# Patient Record
Sex: Female | Born: 2000 | Hispanic: Yes | Marital: Single | Smoking: Never smoker
Health system: Southern US, Community
[De-identification: ages and names within clinical notes are randomized; demographics above are authoritative.]

## PROBLEM LIST (undated history)

## (undated) DIAGNOSIS — R519 Headache, unspecified: Secondary | ICD-10-CM

## (undated) DIAGNOSIS — Z789 Other specified health status: Secondary | ICD-10-CM

## (undated) DIAGNOSIS — R42 Dizziness and giddiness: Secondary | ICD-10-CM

## (undated) HISTORY — PX: NO PAST SURGERIES: SHX2092

## (undated) HISTORY — DX: Other specified health status: Z78.9

## (undated) HISTORY — DX: Headache, unspecified: R51.9

## (undated) HISTORY — PX: OTHER SURGICAL HISTORY: SHX169

## (undated) HISTORY — DX: Dizziness and giddiness: R42

---
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
## 2020-06-15 ENCOUNTER — Ambulatory Visit (LOCAL_COMMUNITY_HEALTH_CENTER): Payer: Self-pay

## 2020-06-15 ENCOUNTER — Other Ambulatory Visit: Payer: Self-pay

## 2020-06-15 VITALS — BP 92/56 | Ht 59.0 in | Wt 103.0 lb

## 2020-06-15 DIAGNOSIS — Z3201 Encounter for pregnancy test, result positive: Secondary | ICD-10-CM

## 2020-06-15 LAB — PREGNANCY, URINE: Preg Test, Ur: POSITIVE — AB

## 2020-06-15 MED ORDER — PRENATAL VITAMIN 27-0.8 MG PO TABS: 1.0000 | ORAL_TABLET | Freq: Every day | ORAL | 0 refills | Status: AC

## 2020-06-15 NOTE — Progress Notes (Signed)
Pt desires care at [facility]; sent to preadmit to inquire about Medicaid and [facility] services.

## 2020-07-14 ENCOUNTER — Encounter: Payer: Self-pay | Admitting: Advanced Practice Midwife

## 2020-07-14 ENCOUNTER — Other Ambulatory Visit: Payer: Self-pay | Admitting: Advanced Practice Midwife

## 2020-07-14 ENCOUNTER — Other Ambulatory Visit: Payer: Self-pay

## 2020-07-14 ENCOUNTER — Ambulatory Visit: Payer: Medicaid Other | Admitting: Advanced Practice Midwife

## 2020-07-14 DIAGNOSIS — Z3401 Encounter for supervision of normal first pregnancy, first trimester: Secondary | ICD-10-CM | POA: Diagnosis not present

## 2020-07-14 DIAGNOSIS — O0930 Supervision of pregnancy with insufficient antenatal care, unspecified trimester: Secondary | ICD-10-CM | POA: Insufficient documentation

## 2020-07-14 DIAGNOSIS — Z3400 Encounter for supervision of normal first pregnancy, unspecified trimester: Secondary | ICD-10-CM | POA: Insufficient documentation

## 2020-07-14 DIAGNOSIS — O99019 Anemia complicating pregnancy, unspecified trimester: Secondary | ICD-10-CM

## 2020-07-14 DIAGNOSIS — Z3402 Encounter for supervision of normal first pregnancy, second trimester: Secondary | ICD-10-CM

## 2020-07-14 DIAGNOSIS — Z3492 Encounter for supervision of normal pregnancy, unspecified, second trimester: Secondary | ICD-10-CM

## 2020-07-14 HISTORY — DX: Anemia complicating pregnancy, unspecified trimester: O99.019

## 2020-07-14 LAB — URINALYSIS
Bilirubin, UA: NEGATIVE
Glucose, UA: NEGATIVE
Ketones, UA: NEGATIVE
Nitrite, UA: NEGATIVE
Protein,UA: NEGATIVE
RBC, UA: NEGATIVE
Specific Gravity, UA: 1.025 (ref 1.005–1.030)
Urobilinogen, Ur: 0.2 mg/dL (ref 0.2–1.0)
pH, UA: 6 (ref 5.0–7.5)

## 2020-07-14 LAB — WET PREP FOR TRICH, YEAST, CLUE
Trichomonas Exam: NEGATIVE
Yeast Exam: NEGATIVE

## 2020-07-14 LAB — HEMOGLOBIN, FINGERSTICK: Hemoglobin: 10.7 g/dL — ABNORMAL LOW (ref 11.1–15.9)

## 2020-07-14 MED ORDER — FERROUS SULFATE 324 (65 FE) MG PO TBEC: 1.0000 | DELAYED_RELEASE_TABLET | Freq: Every morning | ORAL | 0 refills | Status: DC

## 2020-07-14 NOTE — Progress Notes (Signed)
[facility] HEALTH DEPT [facility] [address] [place] [address] [phone]  INITIAL PRENATAL VISIT NOTE  Subjective:  Michele Love is a 19 y.o.SHF G1P0000 at [redacted]w[redacted]d being seen today to start prenatal care at the [facility]. She feels "good" about surprise pregnancy with no birth control.  20 yo unemployed FOB feels "happy" about pregnancy and is not in school; in 1 year supportive relationship.  They are both living with FOB's mom and his 2 siblings 27 and 40 yo.  She is not in school and not working.  LMP 01/22/20.  She went to [place] for 2 wks in 01/2020.  Her mom lives in [place] and her Dad lives in [place]. She was born in [place] but moved here when she was 65 mo old.  Denies cigs, vaping, cigars, ETOH.  Denies any u/s or ER visits this pregnancy.  Transportation issues and she didn't know she was pregnant is reason for late entry to care.   She is currently monitored for the following issues for this low-risk pregnancy and has Encounter for supervision of normal first pregnancy in first trimester and Late prenatal care 24 wks on their problem list.  Patient reports no complaints.  Contractions: Not present. Vag. Bleeding: None.  Movement: Present. Denies leaking of fluid.   Indications for ASA therapy (per uptodate) One of the following: Previous pregnancy with preeclampsia, especially early onset and with an adverse outcome No Multifetal gestation No Chronic hypertension No Type 1 or 2 diabetes mellitus No Chronic kidney disease No Autoimmune disease (antiphospholipid syndrome, systemic lupus erythematosus) No  Two or more of the following: Nulliparity Yes Obesity (body mass index >30 kg/m2) No Family history of preeclampsia in mother or sister No Age ?35 years No Sociodemographic characteristics (African American race, low socioeconomic level) Yes Personal risk factors (eg, previous pregnancy with low  birth weight or small for gestational age infant, previous adverse pregnancy outcome [eg, stillbirth], interval >10 years between pregnancies) No    The following portions of the patient's history were reviewed and updated as appropriate: allergies, current medications, past family history, past medical history, past social history, past surgical history and problem list. Problem list updated.  Objective:   Vitals:   07/14/20 0847  BP: 95/62  Pulse: 84  Temp: 97.7 F (36.5 C)  Weight: 109 lb 6.4 oz (49.6 kg)    Fetal Status:     Movement: Present     Physical Exam Vitals and nursing note reviewed.  Constitutional:      General: She is not in acute distress.    Appearance: Normal appearance. She is well-developed and normal weight.  HENT:     Head: Normocephalic and atraumatic.     Right Ear: External ear normal.     Left Ear: External ear normal.     Nose: Nose normal. No congestion or rhinorrhea.     Mouth/Throat:     Lips: Pink.     Mouth: Mucous membranes are moist.     Dentition: Normal dentition. No dental caries.     Pharynx: Oropharynx is clear. Uvula midline.  Eyes:     General: No scleral icterus.    Conjunctiva/sclera: Conjunctivae normal.  Neck:     Thyroid: No thyroid mass or thyromegaly.  Cardiovascular:     Rate and Rhythm: Normal rate.     Pulses: Normal pulses.     Comments: Extremities are warm and well perfused Pulmonary:  Effort: Pulmonary effort is normal.     Breath sounds: Normal breath sounds.  Chest:     Breasts: Breasts are symmetrical.        Right: Normal. No mass, nipple discharge or skin change.        Left: Normal. No mass, nipple discharge or skin change.  Abdominal:     Palpations: Abdomen is soft.     Tenderness: There is no abdominal tenderness.     Comments: Gravid, soft without tenderness, fundal height 24 cm, FHR=150   Genitourinary:    General: Normal vulva.     Exam position: Lithotomy position.     Pubic Area: No rash.       Labia:        Right: No rash.        Left: No rash.      Vagina: Vaginal discharge (thick white creamy leukorrhea, ph<4.5) present.     Cervix: Normal.     Uterus: Enlarged (Gravid 24 wk size). Not tender.      Adnexa: Right adnexa normal and left adnexa normal.     Rectum: Normal. No external hemorrhoid.  Musculoskeletal:     Right lower leg: No edema.     Left lower leg: No edema.  Lymphadenopathy:     Upper Body:     Right upper body: No axillary adenopathy.     Left upper body: No axillary adenopathy.  Skin:    General: Skin is warm.     Capillary Refill: Capillary refill takes less than 2 seconds.  Neurological:     Mental Status: She is alert.     Assessment and Plan:  Pregnancy: G1P0000 at [redacted]w[redacted]d  1. Encounter for supervision of normal first pregnancy in first trimester Late entry to care Anatomy u/s now please - HIV Antibody (routine testing w rflx) - Prenatal Profile I - HCV Ab w Reflex to Quant PCR - Urine Culture - Chlamydia/GC NAA, Confirmation - Lead, blood (adult age 38 yrs or greater) - Hemoglobinopathy evaluation -[id number] - QuantiFERON-TB Gold Plus - [id number] Drug Screen - WET PREP FOR TRICH, YEAST, CLUE - Urinalysis (Urine Dip) - Hemoglobin, venipuncture  2. Late prenatal care 24 wks Too late for Quad screen    Discussed overview of care and coordination with inpatient delivery practices including [facility], [facility], [facility] and [facility].   Reviewed Centering pregnancy as standard of care at [facility], oriented to room and showed video. Based on EDD, plan for Cycle .    Preterm labor symptoms and general obstetric precautions including but not limited to vaginal bleeding, contractions, leaking of fluid and fetal movement were reviewed in detail with the patient.  Please refer to After Visit Summary for other counseling recommendations.   Return in about 4 weeks (around 08/11/2020) for routine PNC, 28 week labs.  No future  appointments.  Alberteen Spindle, CNM

## 2020-07-14 NOTE — Progress Notes (Signed)
Presents for MH IP. Takes PNV daily. Denies ED/Hospital visit since positive PT. Hgb 10.7, iron given, counseled to take with OJ daily. New OB packet given. Flu shot declined. Sharlyne Pacas, RN

## 2020-07-15 LAB — LEAD, BLOOD (ADULT >= 16 YRS): Lead-Whole Blood: <1 ug/dL (ref 0–4)

## 2020-07-16 ENCOUNTER — Telehealth: Payer: Self-pay

## 2020-07-16 LAB — URINE CULTURE

## 2020-07-16 LAB — HGB FRACTIONATION CASCADE
Hgb A2: 2.8 % (ref 1.8–3.2)
Hgb A: 97.2 % (ref 96.4–98.8)
Hgb F: 0 % (ref 0.0–2.0)
Hgb S: 0 %

## 2020-07-16 NOTE — Telephone Encounter (Signed)
Per Epic, anatomy US scheduled for 07/20/2020 at 11 am with arrival time of 10:45 am. Call to client to verify aware of appt. Client aware of appt and facility location. Jossie Ng, RN

## 2020-07-17 LAB — FE+CBC/D/PLT+TIBC+FER+RETIC
Basophils Absolute: 0 10*3/uL (ref 0.0–0.2)
Basos: 1 %
EOS (ABSOLUTE): 0.1 10*3/uL (ref 0.0–0.4)
Eos: 2 %
Ferritin: 21 ng/mL (ref 15–150)
Hematocrit: 32.1 % — ABNORMAL LOW (ref 34.0–46.6)
Hemoglobin: 10.7 g/dL — ABNORMAL LOW (ref 11.1–15.9)
Immature Grans (Abs): 0 10*3/uL (ref 0.0–0.1)
Immature Granulocytes: 0 %
Iron Saturation: 18 % (ref 15–55)
Iron: 64 ug/dL (ref 27–159)
Lymphocytes Absolute: 2.2 10*3/uL (ref 0.7–3.1)
Lymphs: 27 %
MCH: 28.9 pg (ref 26.6–33.0)
MCHC: 33.3 g/dL (ref 31.5–35.7)
MCV: 87 fL (ref 79–97)
Monocytes Absolute: 0.5 10*3/uL (ref 0.1–0.9)
Monocytes: 6 %
Neutrophils Absolute: 5.3 10*3/uL (ref 1.4–7.0)
Neutrophils: 64 %
Platelets: 307 10*3/uL (ref 150–450)
RBC: 3.7 x10E6/uL — ABNORMAL LOW (ref 3.77–5.28)
RDW: 13.2 % (ref 11.7–15.4)
Retic Ct Pct: 2.2 % (ref 0.6–2.6)
Total Iron Binding Capacity: 359 ug/dL (ref 250–450)
UIBC: 295 ug/dL (ref 131–425)
WBC: 8.2 10*3/uL (ref 3.4–10.8)

## 2020-07-17 LAB — QUANTIFERON-TB GOLD PLUS
QuantiFERON Mitogen Value: 9.11 IU/mL
QuantiFERON Nil Value: 0.06 IU/mL
QuantiFERON TB1 Ag Value: 0.06 IU/mL
QuantiFERON TB2 Ag Value: 0.05 IU/mL
QuantiFERON-TB Gold Plus: NEGATIVE

## 2020-07-17 LAB — CBC/D/PLT+RPR+RH+ABO+AB SCR
Antibody Screen: NEGATIVE
Hepatitis B Surface Ag: NEGATIVE
RPR Ser Ql: REACTIVE — AB
Rh Factor: POSITIVE

## 2020-07-17 LAB — HCV INTERPRETATION

## 2020-07-17 LAB — CHLAMYDIA/GC NAA, CONFIRMATION
Chlamydia trachomatis, NAA: NEGATIVE
Neisseria gonorrhoeae, NAA: NEGATIVE

## 2020-07-17 LAB — HIV ANTIBODY (ROUTINE TESTING W REFLEX): HIV Screen 4th Generation wRfx: NONREACTIVE

## 2020-07-17 LAB — RPR, QUANT+TP ABS (REFLEX)
Rapid Plasma Reagin, Quant: 1:1 {titer} — ABNORMAL HIGH
T Pallidum Abs: NONREACTIVE

## 2020-07-17 LAB — HCV AB W REFLEX TO QUANT PCR: HCV Ab: <0.1 s/co ratio (ref 0.0–0.9)

## 2020-07-20 ENCOUNTER — Telehealth: Payer: Self-pay

## 2020-07-20 ENCOUNTER — Encounter: Payer: Self-pay | Admitting: Advanced Practice Midwife

## 2020-07-20 ENCOUNTER — Ambulatory Visit: Payer: Self-pay

## 2020-07-20 DIAGNOSIS — R768 Other specified abnormal immunological findings in serum: Secondary | ICD-10-CM | POA: Insufficient documentation

## 2020-07-20 LAB — 789231 7+OXYCODONE-BUND
Amphetamines, Urine: NEGATIVE ng/mL
BENZODIAZ UR QL: NEGATIVE ng/mL
Barbiturate screen, urine: NEGATIVE ng/mL
Cannabinoid Quant, Ur: NEGATIVE ng/mL
Cocaine (Metab.): NEGATIVE ng/mL
OPIATE SCREEN URINE: NEGATIVE ng/mL
Oxycodone/Oxymorphone, Urine: NEGATIVE ng/mL
PCP Quant, Ur: NEGATIVE ng/mL

## 2020-07-20 NOTE — Telephone Encounter (Signed)
Call from client stating missed [facility] [place] appt this am and requesting appt be rescheduled. Call to [facility] scheduler and appt rescheduled for 07/22/2020 at 11 am [facility]). Call to client with new appt and she is agreeable with appt. Verbal directions to facility provided. Jossie Ng, RN

## 2020-07-22 ENCOUNTER — Other Ambulatory Visit: Payer: Self-pay

## 2020-07-22 ENCOUNTER — Ambulatory Visit
Admission: RE | Admit: 2020-07-22 | Discharge: 2020-07-22 | Disposition: A | Discharge status: Home / Self Care | Payer: Medicaid Other | Source: Ambulatory Visit | Attending: Advanced Practice Midwife | Admitting: Advanced Practice Midwife

## 2020-07-22 DIAGNOSIS — Z3402 Encounter for supervision of normal first pregnancy, second trimester: Secondary | ICD-10-CM | POA: Diagnosis not present

## 2020-07-26 ENCOUNTER — Encounter: Payer: Self-pay | Admitting: Advanced Practice Midwife

## 2020-07-27 ENCOUNTER — Encounter: Payer: Self-pay | Admitting: Advanced Practice Midwife

## 2020-08-11 ENCOUNTER — Telehealth: Payer: Self-pay

## 2020-08-11 ENCOUNTER — Ambulatory Visit: Payer: Self-pay

## 2020-08-11 NOTE — Telephone Encounter (Signed)
[facility] as scheduled in [facility] 08/11/2020. Call to client with [facility] [employer] (ID # [id number]) to reschedule appt and left message to call with number to call provided. Jossie Ng, RN

## 2020-08-14 NOTE — L&D Delivery Note (Signed)
Date of delivery: @TODAY @ Estimated Date of Delivery: 10/26/20 Patient's last menstrual period was 01/22/2020 (approximate). EGA: [redacted]w[redacted]d  Delivery Note At 4:12 PM a viable female was delivered via Vaginal, Spontaneous (Presentation: ROA ).  APGAR: 9, 9; weight  pending   Placenta status: Spontaneous, Intact.  Cord: 3 vessels with the following complications: nuchal cord x 2 Anesthesia:  none Episiotomy: None Lacerations:  Left labial and left hymenal ring/vaginal Suture Repair: 3.0 vicryl SH Est. Blood Loss (mL):  [redacted]w[redacted]d  Mom to postpartum.  Baby to Couplet care / Skin to Skin.  Nyasha Rahilly 10/25/2020, 4:39 PM     Pt presented to L&D in active labor and augmented with AROM at C/C/+1, Pt pushed effectively x 3 contractions, with delivery of fetal head in ROA position with restitution to ROT, noted tight nuchal cord x 2,  Anterior then posterior shoulders delivered easily with gentle downward traction and delivered into somersault position.  Baby placed on mom's chest, and attended to by peds.  Cord was doubly clamped and cut when pulseless.  Placenta spontaneously delivered, intact.  IM pitocin given for hemorrhage prophylaxis due to issues with IV patency. Perineum, and cervix were inspected and noted to be intact, left labial/periurethral and left hymenal ring laceration were identified and repaired in usual fashion to achieve hemostasis and cosmesis. Fundus was noted to be firm with small amt lochia. Mother and infant remain in birthing suite in stable condition.

## 2020-08-16 NOTE — Telephone Encounter (Signed)
Per Epic appt desk, client has scheduled  [facility] appt for 08/17/2020. Jossie Ng, RN

## 2020-08-17 ENCOUNTER — Ambulatory Visit: Payer: Medicaid Other | Admitting: Family Medicine

## 2020-08-17 ENCOUNTER — Other Ambulatory Visit: Payer: Self-pay

## 2020-08-17 VITALS — BP 99/61 | HR 98 | Temp 98.3°F | Wt 112.6 lb

## 2020-08-17 DIAGNOSIS — O99019 Anemia complicating pregnancy, unspecified trimester: Secondary | ICD-10-CM

## 2020-08-17 DIAGNOSIS — Z3401 Encounter for supervision of normal first pregnancy, first trimester: Secondary | ICD-10-CM

## 2020-08-17 LAB — HEMOGLOBIN, FINGERSTICK: Hemoglobin: 10.6 g/dL — ABNORMAL LOW (ref 11.1–15.9)

## 2020-08-17 NOTE — Progress Notes (Signed)
Hgb = 10.6 and encouraged to take daily PNV and iron tablet. Client correctly verbalizes how to take meds and to take iron tablet with Vitamin C juice. Counseled regarding importance of taking iron supplement and PNV. Jossie Ng, RN

## 2020-08-17 NOTE — Progress Notes (Signed)
   PRENATAL VISIT NOTE  Subjective:  Michele Love is a 20 y.o. G1P0000 at [redacted]w[redacted]d being seen today for ongoing prenatal care.  She is currently monitored for the following issues for this low-risk pregnancy and has Encounter for supervision of normal first pregnancy in first trimester; Late prenatal care 26 wks; Anemia affecting pregnancy, antepartum; and Biological false-positive (BFP) syphilis serology test on 07/14/20 1:1 but T. Pallidum neg on their problem list.  Patient reports no complaints.  Contractions: Not present. Vag. Bleeding: None.  Movement: Present. Denies leaking of fluid/ROM.   The following portions of the patient's history were reviewed and updated as appropriate: allergies, current medications, past family history, past medical history, past social history, past surgical history and problem list. Problem list updated.  Objective:   Vitals:   08/17/20 1124  BP: 99/61  Pulse: 98  Temp: 98.3 F (36.8 C)  Weight: 112 lb 9.6 oz (51.1 kg)    Fetal Status: Fetal Heart Rate (bpm): 151 Fundal Height: 28 cm Movement: Present  Presentation: Transverse  General:  Alert, oriented and cooperative. Patient is in no acute distress.  Skin: Skin is warm and dry. No rash noted.   Cardiovascular: Normal heart rate noted  Respiratory: Normal respiratory effort, no problems with respiration noted  Abdomen: Soft, gravid, appropriate for gestational age.  Pain/Pressure: Absent     Pelvic: Cervical exam deferred        Extremities: Normal range of motion.  Edema: None  Mental Status: Normal mood and affect. Normal behavior. Normal judgment and thought content.   Assessment and Plan:  Pregnancy: G1P0000 at [redacted]w[redacted]d  1. Encounter for supervision of normal first pregnancy in first trimester   Reports of unable to eat as often as normal and having nausea, discussed with patient changes in body and need for small frequent meals, gave information in protein and help with nausea. Also  MNT referral.    28 week labs completed today, will contact if any problems.   Pt not taking PNV as directed.  Emphasized importance of taking PNV daily and if not able to take tablets to get gummy PNV and do not take at the same time as iron.        Discussed normal anatomy from [place] with patient.    Reports low back pain, informed normal part of pregnancy and more common the further along in the pregnancy.  Back exercises informations given.    - Amb ref to Medical Nutrition Therapy-MNT - Hemoglobin, fingerstick - RPR - HIV-1/HIV-2 Qualitative RNA - Glucose, 1 hour gestational  2. Anemia affecting pregnancy, antepartum  Will review Hgb, last was 10.1.  Patient was not taking Iron as directed.  Discussed importance of taking Iron.  Nursing discussed how to take iron, pt verbalized understanding.  Nursing to follow S.O for low Hbg if low today.    Preterm labor symptoms and general obstetric precautions including but not limited to vaginal bleeding, contractions, leaking of fluid and fetal movement were reviewed in detail with the patient. Please refer to After Visit Summary for other counseling recommendations.  Return in about 2 weeks (around 08/31/2020) for routine prenatal care.  Future Appointments  Date Time Provider Department Center  08/31/2020 11:00 AM [facility] PROVIDER [facility] None    Wendi Snipes, FNP

## 2020-08-17 NOTE — Progress Notes (Signed)
Presents for MH RV at 30.0 weeks. Denies ED/Hospital visit since last RV. Takes PNV, but does not take iron daily. Counseled to take iron once daily with vitamin C drink and separately from PNV. 28 week labs collected. Tdap given, tolerated well. PTL & Kick counts counseled. Sharlyne Pacas, RN

## 2020-08-18 LAB — GLUCOSE, 1 HOUR GESTATIONAL: Gestational Diabetes Screen: 74 mg/dL (ref 65–139)

## 2020-08-18 LAB — RPR: RPR Ser Ql: NONREACTIVE

## 2020-08-20 ENCOUNTER — Telehealth: Payer: Self-pay | Admitting: Dietician

## 2020-08-20 LAB — HIV-1/HIV-2 QUALITATIVE RNA
HIV-1 RNA, Qualitative: NONREACTIVE
HIV-2 RNA, Qualitative: NONREACTIVE

## 2020-08-31 ENCOUNTER — Ambulatory Visit: Payer: Self-pay

## 2020-08-31 ENCOUNTER — Telehealth: Payer: Self-pay

## 2020-08-31 NOTE — Telephone Encounter (Signed)
Agency closed 08/31/2020 am due to hazardous weather and client's appt needs to be rescheduled. Call to client and left message as to why agency closed this am and requested she call to reschedule [facility] RV appt. Number to call provided. Jossie Ng, RN

## 2020-09-03 NOTE — Telephone Encounter (Signed)
Per Epic appt desk, client has MHC RV appt 09/07/2020. Jossie Ng, RN

## 2020-09-07 ENCOUNTER — Ambulatory Visit: Payer: Medicaid Other | Admitting: Advanced Practice Midwife

## 2020-09-07 ENCOUNTER — Other Ambulatory Visit: Payer: Self-pay

## 2020-09-07 DIAGNOSIS — Z3401 Encounter for supervision of normal first pregnancy, first trimester: Secondary | ICD-10-CM

## 2020-09-07 NOTE — Progress Notes (Signed)
   PRENATAL VISIT NOTE  Subjective:  Michele Love is a 20 y.o. G1P0000 at [redacted]w[redacted]d being seen today for ongoing prenatal care.  She is currently monitored for the following issues for this low-risk pregnancy and has Encounter for supervision of normal first pregnancy in first trimester; Late prenatal care 26 wks; Anemia affecting pregnancy, antepartum; and Biological false-positive (BFP) syphilis serology test on 07/14/20 1:1 but T. Pallidum neg on their problem list.  Patient reports no complaints.  Contractions: Not present. Vag. Bleeding: None.  Movement: Present. Denies leaking of fluid/ROM.   The following portions of the patient's history were reviewed and updated as appropriate: allergies, current medications, past family history, past medical history, past social history, past surgical history and problem list. Problem list updated.  Objective:   Vitals:   09/07/20 1345  BP: 99/68  Pulse: 98  Temp: (!) 97.1 F (36.2 C)  Weight: 114 lb 3.2 oz (51.8 kg)    Fetal Status: Fetal Heart Rate (bpm): 140 Fundal Height: 32 cm Movement: Present     General:  Alert, oriented and cooperative. Patient is in no acute distress.  Skin: Skin is warm and dry. No rash noted.   Cardiovascular: Normal heart rate noted  Respiratory: Normal respiratory effort, no problems with respiration noted  Abdomen: Soft, gravid, appropriate for gestational age.  Pain/Pressure: Absent     Pelvic: Cervical exam deferred        Extremities: Normal range of motion.  Edema: None  Mental Status: Normal mood and affect. Normal behavior. Normal judgment and thought content.   Assessment and Plan:  Pregnancy: G1P0000 at [redacted]w[redacted]d  1. Encounter for supervision of normal first pregnancy in first trimester Feels well Reviewed 07/22/20 u/s at 26 2/7 with no previa, anterior placenta, AFI wnl, 3VC, normal anatomy Encouraged regular exercise Taking I FeSo4 daily with oj seperately from vits   Preterm labor symptoms  and general obstetric precautions including but not limited to vaginal bleeding, contractions, leaking of fluid and fetal movement were reviewed in detail with the patient. Please refer to After Visit Summary for other counseling recommendations.  Return in about 2 weeks (around 09/21/2020) for routine PNC.  No future appointments.  Alberteen Spindle, CNM

## 2020-09-07 NOTE — Progress Notes (Signed)
Presents for MH RV at 33.0 weeks. Takes PNV and iron daily. Denies ED/Hospital  visit since last RV. Kick counts counseled. Sharlyne Pacas, RN

## 2020-09-21 ENCOUNTER — Ambulatory Visit: Payer: Medicaid Other

## 2020-09-24 ENCOUNTER — Ambulatory Visit: Payer: Medicaid Other | Admitting: Family Medicine

## 2020-09-24 ENCOUNTER — Other Ambulatory Visit: Payer: Self-pay

## 2020-09-24 VITALS — BP 99/66 | HR 76 | Temp 97.8°F | Wt 114.6 lb

## 2020-09-24 DIAGNOSIS — O99013 Anemia complicating pregnancy, third trimester: Secondary | ICD-10-CM

## 2020-09-24 DIAGNOSIS — Z3401 Encounter for supervision of normal first pregnancy, first trimester: Secondary | ICD-10-CM

## 2020-09-24 LAB — HEMOGLOBIN, FINGERSTICK: Hemoglobin: 10.7 g/dL — ABNORMAL LOW (ref 11.1–15.9)

## 2020-09-24 NOTE — Progress Notes (Signed)
Correctly verbalizes how to take PNV and iron supplement. Takes one in am and one in pm. Takes both with orange juice. Hgb today. Counseled regarding importance of selecting pediatrician - verified has resource at home. Jossie Ng, RN  Hgb = 10.7 and to continue QD iron. Jossie Ng, RN

## 2020-09-24 NOTE — Progress Notes (Signed)
   PRENATAL VISIT NOTE  Subjective:  Michele Love is a 20 y.o. G1P0000 at [redacted]w[redacted]d being seen today for ongoing prenatal care.  She is currently monitored for the following issues for this low-risk pregnancy and has Encounter for supervision of normal first pregnancy in first trimester; Late prenatal care 26 wks; Anemia affecting pregnancy, antepartum; and Biological false-positive (BFP) syphilis serology test on 07/14/20 1:1 but T. Pallidum neg on their problem list.  Patient reports no complaints.  Contractions: Not present. Vag. Bleeding: None.  Movement: Present. Denies leaking of fluid/ROM.   The following portions of the patient's history were reviewed and updated as appropriate: allergies, current medications, past family history, past medical history, past social history, past surgical history and problem list. Problem list updated.  Objective:   Vitals:   09/24/20 1550  BP: 99/66  Pulse: 76  Temp: 97.8 F (36.6 C)  Weight: 114 lb 9.6 oz (52 kg)    Fetal Status: Fetal Heart Rate (bpm): 151 Fundal Height: 34 cm Movement: Present  Presentation: Vertex  General:  Alert, oriented and cooperative. Patient is in no acute distress.  Skin: Skin is warm and dry. No rash noted.   Cardiovascular: Normal heart rate noted  Respiratory: Normal respiratory effort, no problems with respiration noted  Abdomen: Soft, gravid, appropriate for gestational age.  Pain/Pressure: Absent     Pelvic: Cervical exam deferred        Extremities: Normal range of motion.  Edema: None  Mental Status: Normal mood and affect. Normal behavior. Normal judgment and thought content.   Assessment and Plan:  Pregnancy: G1P0000 at [redacted]w[redacted]d  1. Anemia during pregnancy in third trimester Patient Hgb today was 10.7.  Encouraged to continue to take FeSo4 daily with juice.   - Hemoglobin, venipuncture  2. Encounter for supervision of normal first pregnancy in first trimester  Baby ready- Patient does does not  have car seat seat yet, will be gifted at baby shower this weekend. Explained that fire department can install/ check car seat.  Has sleeping arrangement for baby.    -Discusses BCM PP, patient currently wants to use condoms.  Discussed waiting 18 months between pregnancies.     -Feeding plan is breast and formula   Discussed collection of 36 week labs at next visit.    Term labor symptoms and general obstetric precautions including but not limited to vaginal bleeding, contractions, leaking of fluid and fetal movement were reviewed in detail with the patient. Please refer to After Visit Summary for other counseling recommendations.  Return in about 2 weeks (around 10/08/2020) for routine prenatal care.  Future Appointments  Date Time Provider Department Center  10/11/2020  3:00 PM [facility] PROVIDER [facility] None    Wendi Snipes, FNP

## 2020-10-06 NOTE — Addendum Note (Signed)
Addended by: Heywood Bene on: 10/06/2020 01:06 PM   Modules accepted: Orders

## 2020-10-11 ENCOUNTER — Ambulatory Visit: Payer: Medicaid Other

## 2020-10-13 ENCOUNTER — Ambulatory Visit: Payer: Medicaid Other

## 2020-10-15 ENCOUNTER — Other Ambulatory Visit: Payer: Self-pay

## 2020-10-15 ENCOUNTER — Ambulatory Visit: Payer: Medicaid Other | Admitting: Advanced Practice Midwife

## 2020-10-15 VITALS — BP 104/69 | HR 97 | Temp 97.9°F | Wt 117.6 lb

## 2020-10-15 DIAGNOSIS — O0930 Supervision of pregnancy with insufficient antenatal care, unspecified trimester: Secondary | ICD-10-CM

## 2020-10-15 DIAGNOSIS — O99013 Anemia complicating pregnancy, third trimester: Secondary | ICD-10-CM

## 2020-10-15 DIAGNOSIS — O99019 Anemia complicating pregnancy, unspecified trimester: Secondary | ICD-10-CM

## 2020-10-15 DIAGNOSIS — Z3401 Encounter for supervision of normal first pregnancy, first trimester: Secondary | ICD-10-CM

## 2020-10-15 DIAGNOSIS — Z3403 Encounter for supervision of normal first pregnancy, third trimester: Secondary | ICD-10-CM

## 2020-10-15 NOTE — Progress Notes (Signed)
[facility] U/S referral faxed with confirmation..Katie Brewer-Jensen, RN  

## 2020-10-15 NOTE — Progress Notes (Signed)
   PRENATAL VISIT NOTE  Subjective:  Michele Love is a 20 y.o. G1P0000 at [redacted]w[redacted]d being seen today for ongoing prenatal care.  She is currently monitored for the following issues for this low-risk pregnancy and has Encounter for supervision of normal first pregnancy in first trimester; Late prenatal care 26 wks; Anemia affecting pregnancy, antepartum; and Biological false-positive (BFP) syphilis serology test on 07/14/20 1:1 but T. Pallidum neg on their problem list.  Patient reports no complaints.  Contractions: Not present. Vag. Bleeding: None.  Movement: Present. Denies leaking of fluid/ROM.   The following portions of the patient's history were reviewed and updated as appropriate: allergies, current medications, past family history, past medical history, past social history, past surgical history and problem list. Problem list updated.  Objective:   Vitals:   10/15/20 1611  BP: 104/69  Pulse: 97  Temp: 97.9 F (36.6 C)  Weight: 117 lb 9.6 oz (53.3 kg)    Fetal Status: Fetal Heart Rate (bpm): 150 Fundal Height: 34 cm Movement: Present  Presentation: Vertex  General:  Alert, oriented and cooperative. Patient is in no acute distress.  Skin: Skin is warm and dry. No rash noted.   Cardiovascular: Normal heart rate noted  Respiratory: Normal respiratory effort, no problems with respiration noted  Abdomen: Soft, gravid, appropriate for gestational age.  Pain/Pressure: Absent     Pelvic: Cervical exam deferred        Extremities: Normal range of motion.  Edema: None  Mental Status: Normal mood and affect. Normal behavior. Normal judgment and thought content.   Assessment and Plan:  Pregnancy: G1P0000 at [redacted]w[redacted]d  1. Encounter for supervision of normal first pregnancy in first trimester Moved into a trailer this week.   [facility] 10/11/20 and 10/13/20 apts and last apt here was 09/24/20; counseled needs to come weekly and keep apts GC/Chlamydia/GBS cultures self collected today 19 lb 9.6  oz (8.891 kg) Has car seat and ready for baby at home Not working; not exercising--encouraged to exercise 3x/wk x 20 min S<D with no fundal height growth since 09/24/20 (35 wks)--u/s ordered Reviewed last u/s on 07/22/20 at 26 2/7 wks - Culture, beta strep (group b only) - Chlamydia/GC NAA, Confirmation  2. Anemia affecting pregnancy, antepartum Taking I FeSo4 daily with oj  3. Late prenatal care 26 wks    Term labor symptoms and general obstetric precautions including but not limited to vaginal bleeding, contractions, leaking of fluid and fetal movement were reviewed in detail with the patient. Please refer to After Visit Summary for other counseling recommendations.  No follow-ups on file.  No future appointments.  Alberteen Spindle, CNM

## 2020-10-15 NOTE — Progress Notes (Signed)
Patient here for MH RV at 38 3/7. Patient hasn't been seen since 09/24/20. 36 week labs and packet today. Patient is self-collecting labs today.Burt Knack, RN

## 2020-10-17 LAB — CHLAMYDIA/GC NAA, CONFIRMATION
Chlamydia trachomatis, NAA: NEGATIVE
Neisseria gonorrhoeae, NAA: NEGATIVE

## 2020-10-19 ENCOUNTER — Telehealth: Payer: Self-pay

## 2020-10-19 LAB — CULTURE, BETA STREP (GROUP B ONLY): Strep Gp B Culture: NEGATIVE

## 2020-10-19 NOTE — Telephone Encounter (Signed)
Call to [facility] to ascertain if S < D [place] scheduled (referral faxed with confirmation 10/15/2020). Per receptionist, she will call client to schedule appt and phone number verified. Jossie Ng, RN

## 2020-10-21 ENCOUNTER — Telehealth: Payer: Self-pay

## 2020-10-21 NOTE — Telephone Encounter (Signed)
Call to [facility] to ascertain if [place] appt scheduled as they were to notify client of appt. Per receptionist, [place] appt scheduled for 10/22/2020 at 1:30 pm. Jossie Ng, RN

## 2020-10-21 NOTE — Telephone Encounter (Signed)
Client with [facility] [place] 10/22/2020 at 1330 and [facility] MHC RV appt at 1400. Call to client to reschedule appt (EGA = 39/2 tomorrow). Client scheduled for later pm appt and to arrive at 3:45 pm. Client aware she will be worked in. Jossie Ng, RN

## 2020-10-22 ENCOUNTER — Ambulatory Visit: Payer: Medicaid Other | Admitting: Family Medicine

## 2020-10-22 ENCOUNTER — Other Ambulatory Visit: Payer: Self-pay

## 2020-10-22 ENCOUNTER — Ambulatory Visit: Payer: Medicaid Other

## 2020-10-22 VITALS — BP 100/71 | HR 88 | Temp 97.6°F | Wt 117.2 lb

## 2020-10-22 DIAGNOSIS — Z3400 Encounter for supervision of normal first pregnancy, unspecified trimester: Secondary | ICD-10-CM

## 2020-10-22 DIAGNOSIS — Z3401 Encounter for supervision of normal first pregnancy, first trimester: Secondary | ICD-10-CM

## 2020-10-22 DIAGNOSIS — O0930 Supervision of pregnancy with insufficient antenatal care, unspecified trimester: Secondary | ICD-10-CM

## 2020-10-22 DIAGNOSIS — Z3403 Encounter for supervision of normal first pregnancy, third trimester: Secondary | ICD-10-CM

## 2020-10-22 DIAGNOSIS — O99019 Anemia complicating pregnancy, unspecified trimester: Secondary | ICD-10-CM

## 2020-10-22 LAB — HEMOGLOBIN, FINGERSTICK: Hemoglobin: 11 g/dL — ABNORMAL LOW (ref 11.1–15.9)

## 2020-10-22 NOTE — Progress Notes (Signed)
   PRENATAL VISIT NOTE  Subjective:  Michele Love is a 20 y.o. G1P0000 at [redacted]w[redacted]d being seen today for ongoing prenatal care.  She is currently monitored for the following issues for this low-risk pregnancy and has Supervision of normal first pregnancy, antepartum; Late prenatal care 26 wks; Anemia affecting pregnancy, antepartum; and Biological false-positive (BFP) syphilis serology test on 07/14/20 1:1 but T. Pallidum neg on their problem list.  Patient reports Contractions (1-2 per day).  Contractions: Not present. Vag. Bleeding: None.  Movement: Present. Denies leaking of fluid/ROM.   The following portions of the patient's history were reviewed and updated as appropriate: allergies, current medications, past family history, past medical history, past social history, past surgical history and problem list. Problem list updated.  Objective:   Vitals:   10/22/20 1637  BP: 100/71  Pulse: 88  Temp: 97.6 F (36.4 C)  Weight: 117 lb 3.2 oz (53.2 kg)    Fetal Status: Fetal Heart Rate (bpm): 145 Fundal Height: 37 cm Movement: Present  Presentation: Vertex  General:  Alert, oriented and cooperative. Patient is in no acute distress.  Skin: Skin is warm and dry. No rash noted.   Cardiovascular: Normal heart rate noted  Respiratory: Normal respiratory effort, no problems with respiration noted  Abdomen: Soft, gravid, appropriate for gestational age.  Pain/Pressure: Absent     Pelvic: Cervical exam deferred        Extremities: Normal range of motion.  Edema: None  Mental Status: Normal mood and affect. Normal behavior. Normal judgment and thought content.   Assessment and Plan:  Pregnancy: G1P0000 at [redacted]w[redacted]d  1. Encounter for supervision of normal first pregnancy in third trimester Up to date Reviewed pregnancy box and updated Has crib/bassinet and car seat Reviewed recommendation for IOL at 41 wks, patient prefers IOL at mid 41st week. Discussed need for NST at [facility] at 41 wk  exactly and she is in agreement Filled out IOL form and RN to fax to [facility] Consider cervical check at next visit and possible sweeping of membranes based on exam but this would need to be discussed at the time as we did not discuss sweeping today.  - Hemoglobin, venipuncture  2. Late prenatal care 26 wks   3. Anemia affecting pregnancy, antepartum HGB is improving   Term labor symptoms and general obstetric precautions including but not limited to vaginal bleeding, contractions, leaking of fluid and fetal movement were reviewed in detail with the patient. Please refer to After Visit Summary for other counseling recommendations.   Return in about 1 week (around 10/29/2020) for Routine prenatal care.  Future Appointments  Date Time Provider Department Center  10/29/2020  3:20 PM [facility] PROVIDER [facility] None    Federico Flake, MD

## 2020-10-22 NOTE — Progress Notes (Signed)
97.6Kept [facility] [place] appt today and in outguide for review. Correctly verbalized how to take PNV and iron. Takes iron with orange juice. Hgb due today. Jossie Ng, RN  Hgb = 11.0 and to continue QD iron and PNV as above. Jossie Ng, RN  [facility] IOL referral faxed with records and fax confirmation received. Jossie Ng, RN

## 2020-10-24 ENCOUNTER — Observation Stay
Admission: EM | Admit: 2020-10-24 | Discharge: 2020-10-24 | Disposition: A | Discharge status: Home / Self Care | Payer: Medicaid Other | Source: Home / Self Care | Admitting: Obstetrics and Gynecology

## 2020-10-24 ENCOUNTER — Encounter: Payer: Self-pay | Admitting: Obstetrics and Gynecology

## 2020-10-24 ENCOUNTER — Other Ambulatory Visit: Payer: Self-pay

## 2020-10-24 DIAGNOSIS — O479 False labor, unspecified: Secondary | ICD-10-CM | POA: Diagnosis present

## 2020-10-24 DIAGNOSIS — O471 False labor at or after 37 completed weeks of gestation: Secondary | ICD-10-CM | POA: Insufficient documentation

## 2020-10-24 DIAGNOSIS — Z3A39 39 weeks gestation of pregnancy: Secondary | ICD-10-CM | POA: Insufficient documentation

## 2020-10-24 NOTE — OB Triage Note (Signed)
Pt reports ctx since last night. Denies vaginal bleeding, LOF. Reports + fetal movement. Elaina Hoops

## 2020-10-24 NOTE — OB Triage Note (Signed)
Pt discharged home in stable condition. RN provided discharge instructions to pt including information on when to come back. Pt verbalized understanding and all questions answered at this time.

## 2020-10-24 NOTE — Discharge Instructions (Signed)
LABOR: When contractions begin, you should start to time them from the beginning of one contraction to the beginning of the next.  When contractions are 5-10 minutes apart or less and have been regular for at least an hour, you should call your health care provider.  Notify your doctor if any of the following occur: 1. Bleeding from the vagina 7. Sudden, constant, or occasional abdominal pain  2. Pain or burning when urinating 8. Sudden gushing of fluid from the vagina (with or without continued leaking)  3. Chills or fever 9. Fainting spells, "black outs" or loss of consciousness  4. Increase in vaginal discharge 10. Severe or continued nausea or vomiting  5. Pelvic pressure (sudden increase) 11. Blurring of vision or spots before the eyes  6. Baby moving less than usual 12. Leaking of fluid    FETAL KICK COUNT: Lie on your left side for one hour after a meal, and count the number of times your baby kicks. If it is less than 5 times, get up, move around and drink some juice. Repeat the test 30 minutes later. If it is still less than 5 kicks in an hour, notify your doctor.       First Stage of Labor Labor is your body's natural process of moving your baby and other structures, including the placenta and umbilical cord, out of your uterus. There are three stages of labor. How long each stage lasts is different for every woman. But certain events happen during each stage that are the same for everyone.  The first stage starts when true labor begins. This stage ends when your cervix, which is the opening from your uterus into your vagina, is completely open (dilated).  The second stage begins when your cervix is fully dilated and you start pushing. This stage ends when your baby is born.  The third stage is the delivery of the organ that nourished your baby during pregnancy (placenta). First stage of labor As your due date gets closer, you may start to notice certain physical changes that  mean labor is going to start soon. You may feel that your baby has dropped lower into your pelvis. You may experience irregular, often painless, contractions that go away when you walk around or lie down ([employer] contractions). This is also called false labor. The first stage of labor begins when you start having contractions that come at regular (evenly spaced) intervals and your cervix starts to get thinner and wider in preparation for your baby to pass through. Birth care providers measure the dilation of your cervix in centimeters (cm). One centimeter is a little less than one-half of an inch. The first stage ends when your cervix is dilated to 10 cm. The first stage of labor is divided into three phases:  Early phase.  Active phase.  Transitional phase. The length of the first stage of labor varies. It may be longer if this is your first pregnancy. You may spend most of this stage at home trying to relax and stay comfortable. How does this affect me? During the first stage of labor, you will move through three phases. What happens in the early phase?  You will start to have regular contractions that last 30-60 seconds. Contractions may come every 5-20 minutes. Keep track of your contractions and call your birth care provider.  Your water may break during this phase.  You may notice a clear or slightly bloody discharge of mucus (mucus plug) from your vagina.  Your cervix will dilate to 3-6 cm. What happens in the active phase? The active phase usually lasts 3-5 hours. You may go to the hospital or birth center around this time. During the active phase:  Your contractions will become stronger, longer, and more uncomfortable.  Your contractions may last 45-90 seconds and come every 3-5 minutes.  You may feel lower back pain.  Your birth care providers may examine your cervix and feel your belly to find the position of your baby.  You may have a monitor strapped to your belly to  measure your contractions and your baby's heart rate.  You may start using your pain management options.  Your cervix may be dilated to 6 cm and may start to dilate more quickly. What happens in the transitional phase? The transitional phase typically lasts from 30 minutes to 2 hours. At the end of this phase, your cervix will be fully dilated to 10 cm. During the transitional phase:  Contractions will get stronger and longer.  Contractions may last 60-90 seconds and come less than 2 minutes apart.  You may feel hot flashes, chills, or nausea. How does this affect my baby? During the first stage of labor, your baby will gradually move down into your birth canal. Follow these instructions at home and in the hospital or birth center:  When labor first begins, try to stay calm. You are still in the early phase. If it is night, try to get some sleep. If it is day, try to relax and save your energy. You may want to make some calls and get ready to go to the hospital or birth center.  When you are in the early phase, try these methods to help ease discomfort: ? Deep breathing and muscle relaxation. ? Taking a walk. ? Taking a warm bath or shower.  Drink some fluids and have a light snack if you feel like it.  Keep track of your contractions.  Based on the plan you created with your birth care provider, call when your contractions indicate it is time.  If your water breaks, note the time, color, and odor of the fluid.  When you are in the active phase, do your breathing exercises and rely on your support people and your team of birth care providers.   Contact a health care provider if:  Your contractions are strong and regular.  You have lower back pain or cramping.  Your water breaks.  You lose your mucus plug. Get help right away if you:  Have a severe headache that does not go away.  Have changes in your vision.  Have severe pain in your upper belly.  Do not feel the  baby move.  Have bright red bleeding. Summary  The first stage of labor starts when true labor begins, and it ends when your cervix is dilated to 10 cm.  The first stage of labor has three phases: early, active, and transitional.  Your baby moves into the birth canal during the first stage of labor.  You may have contractions that become stronger and longer. You may also lose your mucus plug and have your water break.  Call your birth care provider when your contractions are frequent and strong enough to go to the hospital or birth center. This information is not intended to replace advice given to you by your health care provider. Make sure you discuss any questions you have with your health care provider. Document Revised: 11/21/2018 Document Reviewed: 10/14/2017 Elsevier  Elsevier Patient Education  2021 Elsevier Inc.   

## 2020-10-24 NOTE — Discharge Summary (Signed)
Patient ID: Michele Love MRN: [id number] DOB/AGE: 2001-04-29 20 y.o.  Admit date: 10/24/2020 Discharge date: 10/24/2020  Admission Diagnoses: Uterine contractions since 11pm last night.  Discharge Diagnoses: Not in labor  Prenatal Procedures: NST  Consults: None  Significant Diagnostic Studies: None  Treatments: none  Hospital Course:  This is a 20 y.o. G1P0000 with IUP at [redacted]w[redacted]d seen for uterine contractions, noted to have a cervical exam of 0/80/-2.  No leaking of fluid and no bleeding.  She was observed, fetal heart rate monitoring remained reassuring, and she had no signs/symptoms of progressing labor or other maternal-fetal concerns.  Her cervical exam was unchanged from admission.  She was deemed stable for discharge to home with outpatient follow up.  Discharge Physical Exam:  BP 119/73 (BP Location: Left Arm)   Pulse 99   Temp 97.7 F (36.5 C) (Oral)   Resp 16   Ht 5' (1.524 m)   Wt 53.1 kg   LMP 01/22/2020 (Approximate) Comment: normal  BMI 22.85 kg/m   General: NAD CV: RRR Pulm: CTABL, nl effort ABD: s/nd/nt, gravid DVT Evaluation: LE non-ttp, no evidence of DVT on exam.  NST: FHR baseline: 140 bpm Variability: moderate Accelerations: yes Decelerations: none Category/reactivity: reactive   TOCO: quiet SVE:  Dilation: Fingertip Effacement (%): 80 Station: -2 Exam by:: Meya Clutter CNM   Discharge Condition: Stable  Disposition: Discharge disposition: 01-Home or Self Care        Allergies as of 10/24/2020   No Known Allergies     Medication List    STOP taking these medications   lactulose 10 GM/15ML solution Commonly known as: CHRONULAC     TAKE these medications   ferrous sulfate 324 (65 Fe) MG Tbec Take 1 tablet (325 mg total) by mouth every morning.   multivitamin-prenatal 27-0.8 MG Tabs tablet Take 1 tablet by mouth daily at 12 noon.       Follow-up Information    [facility] DEPT. Go on 10/29/2020.    Why: to your already scheduled appointment Contact information: [address] [place] [address] [phone]              Signed:  Quillian Quince 10/24/2020 8:38 PM

## 2020-10-25 ENCOUNTER — Inpatient Hospital Stay
Admission: EM | Admit: 2020-10-25 | Discharge: 2020-10-27 | DRG: 807 | Disposition: A | Discharge status: Home / Self Care | Payer: Medicaid Other | Attending: Obstetrics and Gynecology | Admitting: Obstetrics and Gynecology

## 2020-10-25 ENCOUNTER — Encounter: Payer: Self-pay | Admitting: Obstetrics and Gynecology

## 2020-10-25 DIAGNOSIS — Z20822 Contact with and (suspected) exposure to covid-19: Secondary | ICD-10-CM | POA: Diagnosis present

## 2020-10-25 DIAGNOSIS — Z3403 Encounter for supervision of normal first pregnancy, third trimester: Secondary | ICD-10-CM

## 2020-10-25 DIAGNOSIS — D509 Iron deficiency anemia, unspecified: Secondary | ICD-10-CM | POA: Diagnosis present

## 2020-10-25 DIAGNOSIS — Z3A39 39 weeks gestation of pregnancy: Secondary | ICD-10-CM | POA: Diagnosis not present

## 2020-10-25 DIAGNOSIS — O9902 Anemia complicating childbirth: Secondary | ICD-10-CM | POA: Diagnosis present

## 2020-10-25 DIAGNOSIS — Z3400 Encounter for supervision of normal first pregnancy, unspecified trimester: Secondary | ICD-10-CM

## 2020-10-25 DIAGNOSIS — O26893 Other specified pregnancy related conditions, third trimester: Secondary | ICD-10-CM | POA: Diagnosis present

## 2020-10-25 LAB — CBC
HCT: 36.2 % (ref 36.0–46.0)
Hemoglobin: 12.1 g/dL (ref 12.0–15.0)
MCH: 28.3 pg (ref 26.0–34.0)
MCHC: 33.4 g/dL (ref 30.0–36.0)
MCV: 84.8 fL (ref 80.0–100.0)
Platelets: 225 10*3/uL (ref 150–400)
RBC: 4.27 MIL/uL (ref 3.87–5.11)
RDW: 14.3 % (ref 11.5–15.5)
WBC: 10.4 10*3/uL (ref 4.0–10.5)
nRBC: 0 % (ref 0.0–0.2)

## 2020-10-25 LAB — ABO/RH: ABO/RH(D): O POS

## 2020-10-25 LAB — TYPE AND SCREEN
ABO/RH(D): O POS
Antibody Screen: NEGATIVE

## 2020-10-25 LAB — RESP PANEL BY RT-PCR (FLU A&B, COVID) ARPGX2
Influenza A by PCR: NEGATIVE
Influenza B by PCR: NEGATIVE
SARS Coronavirus 2 by RT PCR: NEGATIVE

## 2020-10-25 MED ORDER — OXYTOCIN 10 UNIT/ML IJ SOLN
INTRAMUSCULAR | Status: AC
  Administered 2020-10-25: 10 [IU] via INTRAMUSCULAR
  Filled 2020-10-25: qty 2

## 2020-10-25 MED ORDER — ACETAMINOPHEN 325 MG PO TABS: 650.0000 mg | ORAL_TABLET | ORAL | Status: DC | PRN

## 2020-10-25 MED ORDER — LIDOCAINE HCL (PF) 1 % IJ SOLN
INTRAMUSCULAR | Status: AC
  Filled 2020-10-25: qty 30

## 2020-10-25 MED ORDER — MISOPROSTOL 200 MCG PO TABS
ORAL_TABLET | ORAL | Status: AC
  Filled 2020-10-25: qty 4

## 2020-10-25 MED ORDER — AMMONIA AROMATIC IN INHA
RESPIRATORY_TRACT | Status: AC
  Filled 2020-10-25: qty 10

## 2020-10-25 MED ORDER — IBUPROFEN 600 MG PO TABS
600.0000 mg | ORAL_TABLET | Freq: Four times a day (QID) | ORAL | Status: DC
  Administered 2020-10-25 – 2020-10-27 (×6): 600 mg via ORAL
  Filled 2020-10-25 (×6): qty 1

## 2020-10-25 MED ORDER — ONDANSETRON HCL 4 MG/2ML IJ SOLN: 4.0000 mg | Freq: Four times a day (QID) | INTRAMUSCULAR | Status: DC | PRN

## 2020-10-25 MED ORDER — WITCH HAZEL-GLYCERIN EX PADS
1.0000 "application " | MEDICATED_PAD | CUTANEOUS | Status: DC | PRN
  Filled 2020-10-25: qty 100

## 2020-10-25 MED ORDER — FENTANYL CITRATE (PF) 100 MCG/2ML IJ SOLN: 50.0000 ug | INTRAMUSCULAR | Status: DC | PRN

## 2020-10-25 MED ORDER — FERROUS SULFATE 325 (65 FE) MG PO TABS
325.0000 mg | ORAL_TABLET | Freq: Two times a day (BID) | ORAL | Status: DC
Start: 2020-10-26 — End: 2020-10-27
  Administered 2020-10-26 – 2020-10-27 (×3): 325 mg via ORAL
  Filled 2020-10-25 (×3): qty 1

## 2020-10-25 MED ORDER — SIMETHICONE 80 MG PO CHEW: 80.0000 mg | CHEWABLE_TABLET | ORAL | Status: DC | PRN

## 2020-10-25 MED ORDER — PRENATAL MULTIVITAMIN CH
1.0000 | ORAL_TABLET | Freq: Every day | ORAL | Status: DC
  Administered 2020-10-26: 1 via ORAL
  Filled 2020-10-25: qty 1

## 2020-10-25 MED ORDER — SENNOSIDES-DOCUSATE SODIUM 8.6-50 MG PO TABS
2.0000 | ORAL_TABLET | Freq: Every day | ORAL | Status: DC
  Administered 2020-10-26 – 2020-10-27 (×2): 2 via ORAL
  Filled 2020-10-25 (×2): qty 2

## 2020-10-25 MED ORDER — IBUPROFEN 600 MG PO TABS
ORAL_TABLET | ORAL | Status: AC
  Filled 2020-10-25: qty 1

## 2020-10-25 MED ORDER — DIBUCAINE (PERIANAL) 1 % EX OINT: 1.0000 "application " | TOPICAL_OINTMENT | CUTANEOUS | Status: DC | PRN

## 2020-10-25 MED ORDER — OXYTOCIN-SODIUM CHLORIDE 30-0.9 UT/500ML-% IV SOLN
INTRAVENOUS | Status: AC
  Filled 2020-10-25: qty 1000

## 2020-10-25 MED ORDER — LACTATED RINGERS IV SOLN: 500.0000 mL | INTRAVENOUS | Status: DC | PRN

## 2020-10-25 MED ORDER — ONDANSETRON HCL 4 MG PO TABS: 4.0000 mg | ORAL_TABLET | ORAL | Status: DC | PRN

## 2020-10-25 MED ORDER — DIPHENHYDRAMINE HCL 25 MG PO CAPS: 25.0000 mg | ORAL_CAPSULE | Freq: Four times a day (QID) | ORAL | Status: DC | PRN

## 2020-10-25 MED ORDER — COCONUT OIL OIL
1.0000 "application " | TOPICAL_OIL | Status: DC | PRN
  Filled 2020-10-25: qty 120

## 2020-10-25 MED ORDER — LIDOCAINE HCL (PF) 1 % IJ SOLN
30.0000 mL | INTRAMUSCULAR | Status: AC | PRN
  Administered 2020-10-25: 30 mL via SUBCUTANEOUS

## 2020-10-25 MED ORDER — LACTATED RINGERS IV SOLN
INTRAVENOUS | Status: DC
Start: 2020-10-25 — End: 2020-10-25

## 2020-10-25 MED ORDER — SOD CITRATE-CITRIC ACID 500-334 MG/5ML PO SOLN: 30.0000 mL | ORAL | Status: DC | PRN

## 2020-10-25 MED ORDER — OXYTOCIN-SODIUM CHLORIDE 30-0.9 UT/500ML-% IV SOLN
2.5000 [IU]/h | INTRAVENOUS | Status: DC
  Administered 2020-10-25: 2.5 [IU]/h via INTRAVENOUS

## 2020-10-25 MED ORDER — ZOLPIDEM TARTRATE 5 MG PO TABS: 5.0000 mg | ORAL_TABLET | Freq: Every evening | ORAL | Status: DC | PRN

## 2020-10-25 MED ORDER — OXYTOCIN BOLUS FROM INFUSION
333.0000 mL | Freq: Once | INTRAVENOUS | Status: AC
  Administered 2020-10-25: 333 mL via INTRAVENOUS

## 2020-10-25 MED ORDER — ONDANSETRON HCL 4 MG/2ML IJ SOLN: 4.0000 mg | INTRAMUSCULAR | Status: DC | PRN

## 2020-10-25 MED ORDER — BENZOCAINE-MENTHOL 20-0.5 % EX AERO
1.0000 "application " | INHALATION_SPRAY | CUTANEOUS | Status: DC | PRN
  Filled 2020-10-25: qty 56

## 2020-10-25 NOTE — Discharge Summary (Signed)
Obstetrical Discharge Summary  Patient Name: Michele Love DOB: September 15, 2000 MRN: [id number]  Date of Admission: 10/25/2020 Date of Delivery: 10/25/20 Delivered by: Hassan Buckler CNM Date of Discharge: 10/27/2020  Primary OB:  [facility] IDP:[id number]'I last menstrual period was 01/22/2020 (approximate). EDC Estimated Date of Delivery: 10/26/20 Gestational Age at Delivery: [redacted]w[redacted]d  Antepartum complications:  1. Late PNC, 24wks 2. Iron deficiency anemia 3. False Pos RPR, neg treponema.   Admitting Diagnosis: active labor Secondary Diagnosis: SVD, right labial/periurethral lacs  Patient Active Problem List   Diagnosis Date Noted  . Labor and delivery, indication for care 10/25/2020  . 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 Pallidum neg 07/20/2020  . Supervision of normal first pregnancy, antepartum 07/14/2020  . Late prenatal care 26 wks 07/14/2020  . Anemia affecting pregnancy, antepartum 07/14/2020    Augmentation: AROM Complications: None Intrapartum complications/course: see delivery note Date of Delivery: 10/25/20 Delivered By: RHassan BucklerCNM Delivery Type: spontaneous vaginal delivery Anesthesia: epidural Placenta: spontaneous Laceration: right labial/periurethral, right hymenal ring.  Episiotomy: none Newborn Data: Live born female  Birth Weight:  5#15 APGAR: 930 9  Newborn Delivery   Birth date/time: 10/25/2020 16:12:00 Delivery type: Vaginal, Spontaneous     Postpartum Procedures: none  Edinburgh:  Edinburgh Postnatal Depression Scale Screening Tool 10/26/2020  I have been able to laugh and see the funny side of things. 0  I have looked forward with enjoyment to things. 1  I have blamed myself unnecessarily when things went wrong. 0  I have been anxious or worried for no good reason. 3  I have felt scared or panicky for no good reason. 0  Things have been getting on top of me. 1  I have been so unhappy that I have had  difficulty sleeping. 1  I have felt sad or miserable. 2  I have been so unhappy that I have been crying. 0  The thought of harming myself has occurred to me. 0  Edinburgh Postnatal Depression Scale Total 8      Post partum course:  Patient had an uncomplicated postpartum course.  By time of discharge on PPD#2, her pain was controlled on oral pain medications; she had appropriate lochia and was ambulating, voiding without difficulty and tolerating regular diet.  She was deemed stable for discharge to home.    Discharge Physical Exam:  BP (!) 88/62 (BP Location: Left Arm) Comment: notify Kendra,RN of bp  Pulse 74   Temp 98.1 F (36.7 C) (Oral)   Resp 18   Ht 5' (1.524 m)   Wt 53.1 kg   LMP 01/22/2020 (Approximate) Comment: normal  SpO2 99%   Breastfeeding Unknown   BMI 22.85 kg/m   General: NAD CV: RRR Pulm: CTABL, nl effort ABD: s/nd/nt, fundus firm and below the umbilicus Lochia: moderate Perineum: well approximated/intact DVT Evaluation: LE non-ttp, no evidence of DVT on exam.  Hemoglobin  Date Value Ref Range Status  10/26/2020 10.3 (L) 12.0 - 15.0 g/dL Final  07/14/2020 10.7 (L) 11.1 - 15.9 g/dL Final   HCT  Date Value Ref Range Status  10/26/2020 30.9 (L) 36.0 - 46.0 % Final   Hematocrit  Date Value Ref Range Status  07/14/2020 32.1 (L) 34.0 - 46.6 % Final     Disposition: stable, discharge to home. Baby Feeding: breastmilk and formula Baby Disposition: home with mom  Rh Immune globulin given: n/a Rubella vaccine given: s/p MMR x 2 Varicella vaccine given: s/p Varivax x 2 Tdap  vaccine given in AP or PP setting: 08/17/20 Flu vaccine given in AP or PP setting: declined  Contraception: condoms  Prenatal Labs:  Blood type/Rh  O Pos  Antibody screen neg  Rubella  s/p MMR x 2  Varicella  s/p varivax x 2  RPR NR  HBsAg Neg  HIV NR  GC neg  Chlamydia neg  Genetic screening  missed window  1 hour GTT  74  3 hour GTT   GBS  negative      Plan:   Sarajean Dessert was discharged to home in good condition. Follow-up appointment with delivering provider in 6 weeks.  Discharge Medications: Allergies as of 10/27/2020   No Known Allergies     Medication List    STOP taking these medications   ferrous sulfate 324 (65 Fe) MG Tbec Replaced by: ferrous sulfate 325 (65 FE) MG tablet     TAKE these medications   acetaminophen 325 MG tablet Commonly known as: Tylenol Take 2 tablets (650 mg total) by mouth every 4 (four) hours as needed (for pain scale < 4).   benzocaine-Menthol 20-0.5 % Aero Commonly known as: DERMOPLAST Apply 1 application topically as needed for irritation (perineal discomfort).   coconut oil Oil Apply 1 application topically as needed.   dibucaine 1 % Oint Commonly known as: NUPERCAINAL Place 1 application rectally as needed for hemorrhoids.   ferrous sulfate 325 (65 FE) MG tablet Take 1 tablet (325 mg total) by mouth 2 (two) times daily with a meal. Replaces: ferrous sulfate 324 (65 Fe) MG Tbec   ibuprofen 600 MG tablet Commonly known as: ADVIL Take 1 tablet (600 mg total) by mouth every 6 (six) hours.   multivitamin-prenatal 27-0.8 MG Tabs tablet Take 1 tablet by mouth daily at 12 noon.   senna-docusate 8.6-50 MG tablet Commonly known as: Senokot-S Take 2 tablets by mouth daily.   simethicone 80 MG chewable tablet Commonly known as: MYLICON Chew 1 tablet (80 mg total) by mouth as needed for flatulence.   witch hazel-glycerin pad Commonly known as: TUCKS Apply 1 application topically as needed for hemorrhoids.        Follow-up Information    Department, [facility]. Schedule an appointment as soon as possible for a visit in 6 week(s).   Contact information: [place] [phone] [phone]               Signed:  Francetta Found, CNM 10/27/2020  8:33 AM

## 2020-10-25 NOTE — H&P (Signed)
OB History & Physical   History of Present Illness:  Chief Complaint: contractions, and suspected water broken  HPI:  Michele Love is a 20 y.o. G1P0000 female at 36w6ddated by LMP and c/w [place] 26wks.  She presents to L&D for increased pain with UCs, suspected ROM around 0900. Reports Active FM, denies bloody show.      Pregnancy Issues: 1. Late PNC, 24wks 2. Iron deficiency anemia 3. False Pos RPR, neg treponema.    Maternal Medical History:   Past Medical History:  Diagnosis Date   Anemia affecting pregnancy, antepartum 07/14/2020   Patient denies medical problems     Past Surgical History:  Procedure Laterality Date   denies     NO PAST SURGERIES      No Known Allergies  Prior to Admission medications   Medication Sig Start Date End Date Taking? Authorizing Provider  ferrous sulfate 324 (65 Fe) MG TBEC Take 1 tablet (325 mg total) by mouth every morning. 07/14/20   Sciora, EReal Cons CNM  Prenatal Vit-Fe Fumarate-FA (MULTIVITAMIN-PRENATAL) 27-0.8 MG TABS tablet Take 1 tablet by mouth daily at 12 noon.    [provider]     [facility]   Social History: She  reports that she has never smoked. She has never used smokeless tobacco. She reports that she does not drink alcohol and does not use drugs.  Family History: family history includes Diabetes in her maternal grandmother and mother.   Review of Systems: A full review of systems was performed and negative except as noted in the HPI.     Physical Exam:  Vital Signs: BP (!) 105/55 (BP Location: Right Arm)    Pulse 81    Temp 98.3 F (36.8 C) (Oral)    Resp 18    LMP 01/22/2020 (Approximate) Comment: normal General: no acute distress.  HEENT: normocephalic, atraumatic Heart: regular rate & rhythm.  No murmurs/rubs/gallops Lungs: clear to auscultation bilaterally, normal respiratory effort Abdomen: soft, gravid, non-tender;  EFW: 7lbs Pelvic:    External: Normal external female genitalia  Cervix: Dilation: 9 / Effacement (%): 90 / Station: Plus 1    Extremities: non-tender, symmetric, no edema bilaterally.  DTRs: 2+  Neurologic: Alert & oriented x 3.    No results found for this or any previous visit (from the past 24 hour(s)).  Pertinent Results:  Prenatal Labs: Blood type/Rh  O Pos  Antibody screen neg  Rubella  s/p MMR x 2  Varicella  s/p varivax x 2  RPR NR  HBsAg Neg  HIV NR  GC neg  Chlamydia neg  Genetic screening  missed window  1 hour GTT  74  3 hour GTT   GBS  negative   FHT: 140bpm, min variability, + accels, variable decel x 1 noted.  TOCO: q2-375m SVE:  Dilation: 9 / Effacement (%): 90 / Station: Plus 1    Cephalic by leopolds/exam  No results found.  Assessment:  Michele Love a [age].o. G1P0000 female at 3924w6dth active labor.   Plan:  1. Admit to Labor & Delivery; consents reviewed and obtained - Covid swab sent on admit.   2. Fetal Well being  - Fetal Tracing: Cat II- variable decel - Group B Streptococcus ppx indicated: negative - Presentation:  cephalic confirmed by exam/Leopolds   3. Routine OB: - Prenatal labs reviewed, as above - Rh O Pos - CBC, T&S, RPR on admit - Clear fluids,  IVF  4. Monitoring of Labor -  Contractions: external toco in place -  Pelvis unproven, adequate for TOL.  -  Plan for AROM -  Plan for continuous fetal monitoring  -  Maternal pain control as desired - Anticipate vaginal delivery  5. Post Partum Planning: - Infant feeding: breast and formula - Contraception: condoms - Tdap 08/17/20 - declined flu vaccine  Francetta Found, CNM 10/25/20 3:50 PM

## 2020-10-26 LAB — CBC
HCT: 30.9 % — ABNORMAL LOW (ref 36.0–46.0)
Hemoglobin: 10.3 g/dL — ABNORMAL LOW (ref 12.0–15.0)
MCH: 28.2 pg (ref 26.0–34.0)
MCHC: 33.3 g/dL (ref 30.0–36.0)
MCV: 84.7 fL (ref 80.0–100.0)
Platelets: 193 10*3/uL (ref 150–400)
RBC: 3.65 MIL/uL — ABNORMAL LOW (ref 3.87–5.11)
RDW: 14.3 % (ref 11.5–15.5)
WBC: 10.9 10*3/uL — ABNORMAL HIGH (ref 4.0–10.5)
nRBC: 0 % (ref 0.0–0.2)

## 2020-10-26 LAB — RPR: RPR Ser Ql: NONREACTIVE

## 2020-10-26 NOTE — Progress Notes (Signed)
Post Partum Day 1 Subjective: Doing well, no complaints.  Tolerating regular diet, pain with PO meds, voiding and ambulating without difficulty.  No CP SOB Fever,Chills, N/V or leg pain; denies nipple or breast pain no HA change of vision, RUQ/epigastric pain  Objective: BP 95/70 (BP Location: Left Arm)   Pulse 79   Temp 98.7 F (37.1 C) (Oral)   Resp 15   Ht 5' (1.524 m)   Wt 53.1 kg   LMP 01/22/2020 (Approximate) Comment: normal  SpO2 100%   Breastfeeding Unknown   BMI 22.85 kg/m    Physical Exam:  General: NAD Breasts: soft/nontender CV: RRR Pulm: nl effort, CTABL Abdomen: soft, NT, BS x 4 Perineum: minimal edema, laceration repair well approximated Lochia: moderate Uterine Fundus: fundus firm and 1 fb below umbilicus DVT Evaluation: no cords, ttp LEs   Recent Labs    10/25/20 1537 10/26/20 0404  HGB 12.1 10.3*  HCT 36.2 30.9*  WBC 10.4 10.9*  PLT 225 193    Assessment/Plan: 20 y.o. G1P1001 postpartum day # 1  - Continue routine PP care - Lactation consult.  - plans Condoms - Acute blood loss anemia - hemodynamically stable and asymptomatic; start po ferrous sulfate BID with stool softeners  - Immunization status:  all Imms up to date    Disposition: Does not desire Dc home today.     Randa Ngo, CNM 10/26/2020  5:53 PM

## 2020-10-27 MED ORDER — WITCH HAZEL-GLYCERIN EX PADS: 1.0000 "application " | MEDICATED_PAD | CUTANEOUS | 12 refills | Status: DC | PRN

## 2020-10-27 MED ORDER — COCONUT OIL OIL: 1.0000 "application " | TOPICAL_OIL | 0 refills | Status: DC | PRN

## 2020-10-27 MED ORDER — SENNOSIDES-DOCUSATE SODIUM 8.6-50 MG PO TABS: 2.0000 | ORAL_TABLET | Freq: Every day | ORAL | 0 refills | Status: DC

## 2020-10-27 MED ORDER — SIMETHICONE 80 MG PO CHEW: 80.0000 mg | CHEWABLE_TABLET | ORAL | 0 refills | Status: DC | PRN

## 2020-10-27 MED ORDER — DIBUCAINE (PERIANAL) 1 % EX OINT: 1.0000 "application " | TOPICAL_OINTMENT | CUTANEOUS | Status: DC | PRN

## 2020-10-27 MED ORDER — FERROUS SULFATE 325 (65 FE) MG PO TABS: 325.0000 mg | ORAL_TABLET | Freq: Two times a day (BID) | ORAL | 0 refills | Status: DC

## 2020-10-27 MED ORDER — IBUPROFEN 600 MG PO TABS: 600.0000 mg | ORAL_TABLET | Freq: Four times a day (QID) | ORAL | 0 refills | Status: DC

## 2020-10-27 MED ORDER — ACETAMINOPHEN 325 MG PO TABS: 650.0000 mg | ORAL_TABLET | ORAL | Status: DC | PRN

## 2020-10-27 MED ORDER — BENZOCAINE-MENTHOL 20-0.5 % EX AERO
1.0000 | INHALATION_SPRAY | CUTANEOUS | Status: DC | PRN
Start: 2020-10-27 — End: 2022-02-06

## 2020-10-27 NOTE — Discharge Instructions (Signed)
Postpartum Care After Vaginal Delivery The following information offers guidance about how to care for yourself from the time you deliver your baby to 6-12 weeks after delivery (postpartum period). If you have problems or questions, contact your health care provider for more specific instructions. Follow these instructions at home: Vaginal bleeding  It is normal to have vaginal bleeding (lochia) after delivery. Wear a sanitary pad for bleeding and discharge. ? During the first week after delivery, the amount and appearance of lochia is often similar to a menstrual period. ? Over the next few weeks, it will gradually decrease to a dry, yellow-brown discharge. ? For most women, lochia stops completely by 4-6 weeks after delivery, but can vary.  Change your sanitary pads frequently. Watch for any changes in your flow, such as: ? A sudden increase in volume. ? A change in color. ? Large blood clots.  If you pass a blood clot from your vagina, save it and call your health care provider. Do not flush blood clots down the toilet before talking with your health care provider.  Do not use tampons or douches until your health care provider approves.  If you are not breastfeeding, your period should return 6-8 weeks after delivery. If you are feeding your baby breast milk only, your period may not return until you stop breastfeeding. Perineal care  Keep the area between the vagina and the anus (perineum) clean and dry. Use medicated pads and pain-relieving sprays and creams as directed.  If you had a surgical cut in the perineum (episiotomy) or a tear, check the area for signs of infection until you are healed. Check for: ? More redness, swelling, or pain. ? Fluid or blood coming from the cut or tear. ? Warmth. ? Pus or a bad smell.  You may be given a squirt bottle to use instead of wiping to clean the perineum area after you use the bathroom. Pat the area gently to dry it.  To relieve pain  caused by an episiotomy, a tear, or swollen veins in the anus (hemorrhoids), take a warm sitz bath 2-3 times a day. In a sitz bath, the warm water should only come up to your hips and cover your buttocks.   Breast care  In the first few days after delivery, your breasts may feel heavy, full, and uncomfortable (breast engorgement). Milk may also leak from your breasts. Ask your health care provider about ways to help relieve the discomfort.  If you are breastfeeding: ? Wear a bra that supports your breasts and fits well. Use breast pads to absorb milk that leaks. ? Keep your nipples clean and dry. Apply creams and ointments as told. ? You may have uterine contractions every time you breastfeed for up to several weeks after delivery. This helps your uterus return to its normal size. ? If you have any problems with breastfeeding, notify your health care provider or lactation consultant.  If you are not breastfeeding: ? Avoid touching your breasts. Do not squeeze out (express) milk. Doing this can make your breasts produce more milk. ? Wear a good-fitting bra and use cold packs to help with swelling. Intimacy and sexuality  Ask your health care provider when you can engage in sexual activity. This may depend upon: ? Your risk of infection. ? How fast you are healing. ? Your comfort and desire to engage in sexual activity.  You are able to get pregnant after delivery, even if you have not had your period. Talk with   your health care provider about methods of birth control (contraception) or family planning if you desire future pregnancies. Medicines  Take over-the-counter and prescription medicines only as told by your health care provider.  Take an over-the-counter stool softener to help ease bowel movements as told by your health care provider.  If you were prescribed an antibiotic medicine, take it as told by your health care provider. Do not stop taking the antibiotic even if you start to  feel better.  Review all previous and current prescriptions to check for possible transfer into breast milk. Activity  Gradually return to your normal activities as told by your health care provider.  Rest as much as possible. Nap while your baby is sleeping. Eating and drinking  Drink enough fluid to keep your urine pale yellow.  To help prevent or relieve constipation, eat high-fiber foods every day.  Choose healthy eating to support breastfeeding or weight loss goals.  Take your prenatal vitamins until your health care provider tells you to stop.   General tips/recommendations  Do not use any products that contain nicotine or tobacco. These products include cigarettes, chewing tobacco, and vaping devices, such as e-cigarettes. If you need help quitting, ask your health care provider.  Do not drink alcohol, especially if you are breastfeeding.  Do not take medications or drugs that are not prescribed to you, especially if you are breastfeeding.  Visit your health care provider for a postpartum checkup within the first 3-6 weeks after delivery.  Complete a comprehensive postpartum visit no later than 12 weeks after delivery.  Keep all follow-up visits for you and your baby. Contact a health care provider if:  You feel unusually sad or worried.  Your breasts become red, painful, or hard.  You have a fever or other signs of an infection.  You have bleeding that is soaking through one pad an hour or you have blood clots.  You have a severe headache that doesn't go away or you have vision changes.  You have nausea and vomiting and are unable to eat or drink anything for 24 hours. Get help right away if:  You have chest pain or difficulty breathing.  You have sudden, severe leg pain.  You faint or have a seizure.  You have thoughts about hurting yourself or your baby. If you ever feel like you may hurt yourself or others, or have thoughts about taking your own life,  get help right away. Go to your nearest emergency department or:  Call your local emergency services (911 in the [place]  The National Suicide Prevention Lifeline at 1-800-273-8255. This suicide crisis helpline is open 24 hours a day.  Text the Crisis Text Line at 741741 (in the [place] Summary  The period of time after you deliver your newborn up to 6-12 weeks after delivery is called the postpartum period.  Keep all follow-up visits for you and your baby.  Review all previous and current prescriptions to check for possible transfer into breast milk.  Contact a health care provider if you feel unusually sad or worried during the postpartum period. This information is not intended to replace advice given to you by your health care provider. Make sure you discuss any questions you have with your health care provider. Document Revised: 04/15/2020 Document Reviewed: 04/15/2020 Elsevier Patient Education  2021 Elsevier Inc. Breastfeeding Tips for a Good Latch Latching is how your baby's mouth attaches to your nipple to breastfeed. It is an important part of breastfeeding. Your baby   may have trouble latching for a number of reasons, such as:  Not being in the right position.  Using a bottle or pacifier too early.  Problems within your baby's mouth, tongue, or lips.  The shape of your nipples.  Your baby being born early (prematurely). Small babies often have a weak suck.  Breasts becoming overfilled with milk (engorged breasts).  Express a little milk to help soften the breast. Work with a breastfeeding specialist (Science writer) to help your baby have a good latch. How does this affect me? A poor latch may cause you to have problems such as:  Cracked nipples.  Sore nipples.  Breasts becoming overfilled with milk  Plugged milk ducts.  Low milk supply.  Breast inflammation.  Breast infection. How does this affect my baby? A poor latch may cause your baby to not be  able to feed well. As a result, he or she may have trouble gaining weight. Follow these instructions at home: How to position your baby  Find a comfortable place to sit or lie down. Your neck and back should be well supported.  If you are seated, place a pillow or rolled-up blanket under your baby. This will bring him or her to the level of your breast.  Make sure that your baby's belly is facing your belly.  Try different positions to find one that works best for you and your baby. How to help your baby latch  To start, you might find it helpful to gently rub your breast. Move your fingertips in a circle as you massage from your chest wall toward your nipple. This helps milk flow. Keep doing this during feeding if needed.  Position your breast. Hold your breast with four fingers underneath and your thumb above your nipple. Keep your fingers away from your nipple and your baby's mouth. Follow these steps to help your baby latch: 1. Rub your baby's lips gently with your finger or nipple. 2. When your baby's mouth is open wide enough, quickly bring your baby to your breast and place your whole nipple into your baby's mouth. Place as much of the colored area around your nipple (areola)as possible into your baby's mouth. 3. Your baby's tongue should be between his or her lower gum and your breast. 4. You should be able to see more areola above your baby's upper lip than below the lower lip. 5. When your baby starts sucking, you will feel a gentle pull on your nipple. You should not feel any pain. Be patient. It is common for a baby to suck for about 2-3 minutes to start the flow of breast milk. 6. Make sure that your baby's mouth is in the right position around your nipple. Your baby's lips should make a seal on your breast and be turned outward.   General instructions  Look for these signs that your baby has latched on to your nipple: ? The baby is quietly tugging or sucking without causing  you pain. ? You hear the baby swallow after every 3 or 4 sucks. ? You see movement above and in front of the baby's ears while he or she is sucking.  Be aware of these signs that your baby has not latched on to your nipple: ? The baby makes sucking sounds or smacking sounds while feeding. ? You have nipple pain.  If your baby is not latched well, put your little finger between your baby's gums and your nipple. This will break the seal. Then try  to help your baby latch again.  If you need help, get help from a breastfeeding specialist. Contact a doctor if:  You have cracking or soreness in your nipples that lasts longer than 1 week.  You have nipple pain.  Your breasts are filled with too much milk (engorgement), and this does not improve after 48-72 hours.  You have a plugged milk duct and a fever.  You follow the tips for a good latch but need more help.  You have a pus-like fluid coming from your breast.  Your baby is not gaining weight.  Your baby loses weight. Summary  Latching is how your baby's mouth attaches to your nipple to breastfeed.  Try different positions for breastfeeding to find one that works best for you and your baby.  A poor latch may cause you to have cracked or sore nipples or other problems.  Work with a breastfeeding specialist (lactation consultant) to help your baby have a good latch. This information is not intended to replace advice given to you by your health care provider. Make sure you discuss any questions you have with your health care provider. Document Revised: 01/28/2020 Document Reviewed: 01/28/2020 Elsevier Patient Education  2021 Elsevier Inc.  

## 2020-10-27 NOTE — Progress Notes (Signed)
Pt discharged with infant.  Discharge instructions, prescriptions and follow up appointment given to and reviewed with pt. Pt verbalized understanding. Escorted out by auxillary. 

## 2020-10-27 NOTE — Lactation Note (Signed)
This note was copied from a baby's chart. Lactation Consultation Note  Patient Name: Girl Maite Burlison [id number] Date: 10/27/2020 Reason for consult: Initial assessment;1st time breastfeeding;Term;Infant < 6lbs Age:21 hours  Initial lactation visit. Mom is G1P1, SVD 42hrs ago to baby girl at [redacted]w[redacted]d who was less than 6lbs. Mom and baby received BF support through MB RN yesterday. Baby initially had shallow latch and was tongue thrusting but seemed to improve over the day yesterday. Mom did provide formula, but was educated on the importance of offering breast before formula at feedings.  LC in room this morning, baby had just finished a brief 5 minute feeding at the breast- fully dressed and in a blanket. LC spoke with mom and mom said quick feedings were the normal; LC educated mom on importance of keeping baby awake at the breast, possibly removing clothing, rubbing back/head, talking to her, and other techniques to keep her awake.   Mom had no questions/concerns at this time. Reviewed newborn feeding patterns and behaviors, normal course of lactation, and milk supply and demand. Reiterated the importance of offering breast before bottle, and provided signs that baby was getting enough at the breast.  Encouraged mom to call out today for BF support as needed before anticipated discharge.  Maternal Data Has patient been taught Hand Expression?: Yes Does the patient have breastfeeding experience prior to this delivery?: No  Feeding Mother's Current Feeding Choice: Breast Milk and Formula  LATCH Score Latch:  (baby had just come off when [facility] entered)                  Lactation Tools Discussed/Used    Interventions Interventions: Breast feeding basics reviewed;Hand express;Education;Coconut oil  Discharge    Consult Status Consult Status: Follow-up Date: 10/27/20 Follow-up type: In-patient    Danford Bad 10/27/2020, 9:31 AM

## 2020-10-29 ENCOUNTER — Ambulatory Visit: Payer: Self-pay

## 2020-12-14 ENCOUNTER — Telehealth: Payer: Self-pay

## 2020-12-14 NOTE — Telephone Encounter (Signed)
TC to patient to schedule PP appointment. Patient has SVD 10/25/2020. LM with number to call.Burt Knack, RN

## 2020-12-15 NOTE — Telephone Encounter (Signed)
Per Epic appt desk, client has post-partum exam scheduled for 12/16/2020. Jossie Ng, RN

## 2020-12-16 ENCOUNTER — Other Ambulatory Visit: Payer: Self-pay

## 2020-12-16 ENCOUNTER — Ambulatory Visit: Payer: Medicaid Other | Admitting: Physician Assistant

## 2020-12-16 ENCOUNTER — Encounter: Payer: Self-pay | Admitting: Physician Assistant

## 2020-12-16 VITALS — BP 96/65 | HR 66 | Temp 97.7°F | Ht 59.0 in | Wt 105.4 lb

## 2020-12-16 DIAGNOSIS — Z3009 Encounter for other general counseling and advice on contraception: Secondary | ICD-10-CM

## 2020-12-16 LAB — HEMOGLOBIN, FINGERSTICK: Hemoglobin: 11.5 g/dL (ref 11.1–15.9)

## 2020-12-16 NOTE — Progress Notes (Signed)
Hgb reviewed - no intervention required per standing order. Jossie Ng, RN

## 2020-12-16 NOTE — Progress Notes (Signed)
Post Partum Exam  Michele Love is a 20 y.o. G46P1001 female who presents for a postpartum visit. She is 7 weeks postpartum following a spontaneous vaginal delivery. I have fully reviewed the prenatal and intrapartum course. The delivery was at 39 6/7 gestational weeks.  Anesthesia: none. Postpartum course has been uneventful. Baby's course has been uneventful. Baby is feeding by Bottle Bleeding no bleeding. Bowel function is normal. Bladder function is normal. Patient is not sexually active. Contraception method is abstinence.   Postpartum depression screening: EPDS score = 3.  Last pap smear done never (just turned age 37).  Review of Systems A comprehensive review of systems was negative except for: Constitutional: positive for occ dizziness  Neuro, occ headache.  Objective:  There were no vitals taken for this visit.  Gen: well appearing, NAD HEENT: no scleral icterus Thyroid without mass or enlargment CV: RR Lung: Normal WOB Breast:performed-yes No dominant mass, no nipple discharge, skin changes or axillary LAD. Ext: warm well perfused  GU: well healed perineum, no lesions, vagina clear without discharge, cervix parous. Uterus 6 wk size, non tender, adnexa nontender. Rectal: performed -  not indicated       Assessment:     Normal postpartum exam. Pap smear done at today's visit.   Plan:   Essential components of care per ACOG recommendations for Comprehensive Postpartum exam:  1.  Mood and well being: Patient with negative depression screening today. Reviewed local resources for support. EPDS is low risk. Reviewed resources and that mood sx in first year after pregnancy are considered related to pregnancy and to reach out for help at [facility] if needed. Discussed [facility] as link to care and availability of LCSW for counseling  - Patient does not use tobacco.  - hx of drug use? No   2. Infant care and feeding:  -Patient currently breastmilk feeding? No   -Recommended  patient engage with WIC/BFpeer counselors  -Counseled to sign new child up for [facility] services -Social determinants of health (SDOH) reviewed in EPIC. No concerns  3. Sexuality, contraception and birth spacing  Contraception: Contraception counseling: Reviewed all forms of birth control options in the tiered based approach. available including abstinence; over the counter/barrier methods; hormonal contraceptive medication including pill, patch, ring, injection,contraceptive implant; hormonal and nonhormonal IUDs; permanent sterilization options including vasectomy and the various tubal sterilization modalities. Risks, benefits, and typical effectiveness rates were reviewed.  Questions were answered.  Written information was also given to the patient to review.  Patient desires condoms, this was given to patient. She will follow up in  12 mo for surveillance.  She was told to call with any further questions, or with any concerns about this method of contraception.  Emphasized use of condoms 100% of the time for STI prevention.   - Patient does not want a pregnancy in the next year.  Desired family size is unsure number of children.  - Reviewed forms of contraception in tiered fashion. Patient desired condoms today.   - Discussed birth spacing of 18 months  4. Sleep and fatigue -Encouraged family/partner/community support of 4 hrs of uninterrupted sleep to help with mood and fatigue  5. Physical Recovery  - Discussed patients delivery and lack of complications - Patient had a small laceration, perineal healing reviewed. Patient expressed understanding - Patient has urinary incontinence? No - Patient is safe to resume physical and sexual activity  6.  Health Maintenance/Chronic Disease Health Maintenance Due  Topic Date Due  . HPV VACCINES (  1 - 2-dose series) Never done  . PAP-Cervical Cytology Screening  11/23/2020  . PAP SMEAR-Modifier  11/23/2020   Pt declines COVID vaccine today. - Last  pap smear performed never (just turned age 73).  1. Family planning services Pt declines more statistically reliable BCM than condoms. - Pap IG (Image Guided) - HIV [place] [facility] - Syphilis Serology, [facility]  2. Postpartum exam Normal PP exam. Enc COVID vaccination - pt declines. - Hemoglobin, fingerstick   Patient given handout about PCP care in the community Given MVI per family planning program guidelines and availability  Follow up in: 12 months or as needed.

## 2020-12-22 LAB — HM HIV SCREENING LAB: HM HIV Screening: NEGATIVE

## 2020-12-23 LAB — PAP IG (IMAGE GUIDED): PAP Smear Comment: 0

## 2021-02-23 DIAGNOSIS — H5213 Myopia, bilateral: Secondary | ICD-10-CM | POA: Diagnosis not present

## 2021-08-14 NOTE — L&D Delivery Note (Signed)
       Delivery Note   Michele Love is a 21 y.o. G2P1001 at [redacted]w[redacted]d Estimated Date of Delivery: 04/27/22  PRE-OPERATIVE DIAGNOSIS:  1) [redacted]w[redacted]d pregnancy.   POST-OPERATIVE DIAGNOSIS:  1) [redacted]w[redacted]d pregnancy s/p     Delivery Type:   spontaneous vaginal delivery  Delivery Anesthesia:  none  Labor Complications:  none    ESTIMATED BLOOD LOSS: 150 ml    FINDINGS:   1) female infant, Apgar scores of    8 at 1 minute and   9 at 5 minutes and a birthweight pending, infant skin to skin.   2) Nuchal cord: none  SPECIMENS:   PLACENTA:   Appearance:  intact , 3 vessel cord, cord blood sample collected   Removal:   spontaneous     Disposition:  per protocol   DISPOSITION:  Infant to left in stable condition in the delivery room, with L&D personnel and mother,  NARRATIVE SUMMARY: Labor course:  Ms. Michele Love is a G2P1001 at [redacted]w[redacted]d who presented for labor management.  She progressed well in labor without pitocin.  She received the no anesthesia and proceeded to complete dilation. She evidenced good maternal expulsive effort during the second stage. She went on to deliver a viable female infant. The placenta delivered without problems and was noted to be complete. A perineal and vaginal examination was performed. Episiotomy/Lacerations:  none The patient tolerated this well.  Doreene Burke, CNM  04/25/2022 8:32 PM

## 2021-09-17 ENCOUNTER — Emergency Department: Payer: Medicaid Other

## 2021-09-17 ENCOUNTER — Emergency Department
Admission: EM | Admit: 2021-09-17 | Discharge: 2021-09-18 | Disposition: A | Discharge status: Home / Self Care | Payer: Medicaid Other | Attending: Student in an Organized Health Care Education/Training Program | Admitting: Student in an Organized Health Care Education/Training Program

## 2021-09-17 ENCOUNTER — Other Ambulatory Visit: Payer: Self-pay

## 2021-09-17 DIAGNOSIS — O26891 Other specified pregnancy related conditions, first trimester: Secondary | ICD-10-CM | POA: Diagnosis not present

## 2021-09-17 DIAGNOSIS — Z3A08 8 weeks gestation of pregnancy: Secondary | ICD-10-CM | POA: Diagnosis not present

## 2021-09-17 DIAGNOSIS — R1031 Right lower quadrant pain: Secondary | ICD-10-CM | POA: Diagnosis not present

## 2021-09-17 DIAGNOSIS — N858 Other specified noninflammatory disorders of uterus: Secondary | ICD-10-CM

## 2021-09-17 DIAGNOSIS — Z3A01 Less than 8 weeks gestation of pregnancy: Secondary | ICD-10-CM | POA: Insufficient documentation

## 2021-09-17 LAB — CBC
HCT: 36.9 % (ref 36.0–46.0)
Hemoglobin: 12.1 g/dL (ref 12.0–15.0)
MCH: 28.3 pg (ref 26.0–34.0)
MCHC: 32.8 g/dL (ref 30.0–36.0)
MCV: 86.2 fL (ref 80.0–100.0)
Platelets: 343 K/uL (ref 150–400)
RBC: 4.28 MIL/uL (ref 3.87–5.11)
RDW: 12.9 % (ref 11.5–15.5)
WBC: 7.2 K/uL (ref 4.0–10.5)
nRBC: 0 % (ref 0.0–0.2)

## 2021-09-17 LAB — TYPE AND SCREEN
ABO/RH(D): O POS
Antibody Screen: NEGATIVE

## 2021-09-17 LAB — COMPREHENSIVE METABOLIC PANEL WITH GFR
ALT: 23 U/L (ref 0–44)
AST: 27 U/L (ref 15–41)
Albumin: 4.3 g/dL (ref 3.5–5.0)
Alkaline Phosphatase: 50 U/L (ref 38–126)
Anion gap: 5 (ref 5–15)
BUN: 9 mg/dL (ref 6–20)
CO2: 24 mmol/L (ref 22–32)
Calcium: 9.2 mg/dL (ref 8.9–10.3)
Chloride: 105 mmol/L (ref 98–111)
Creatinine, Ser: 0.49 mg/dL (ref 0.44–1.00)
GFR, Estimated: >60 mL/min
Glucose, Bld: 105 mg/dL — ABNORMAL HIGH (ref 70–99)
Potassium: 3.2 mmol/L — ABNORMAL LOW (ref 3.5–5.1)
Sodium: 134 mmol/L — ABNORMAL LOW (ref 135–145)
Total Bilirubin: 0.2 mg/dL — ABNORMAL LOW (ref 0.3–1.2)
Total Protein: 8.2 g/dL — ABNORMAL HIGH (ref 6.5–8.1)

## 2021-09-17 LAB — HCG, QUANTITATIVE, PREGNANCY: hCG, Beta Chain, Quant, S: 174458 m[IU]/mL — ABNORMAL HIGH

## 2021-09-17 NOTE — ED Provider Notes (Signed)
----------------------------------------- °  11:06 PM on 09/17/2021 -----------------------------------------  Assuming care from Dr. Roxan Hockey.  In short, Darlean Warmoth is a 21 y.o. female with a chief complaint of abdominal pain during early pregnancy.  Refer to the original H&P for additional details.  The current plan of care is to follow up ultrasounds and reassess.   ----------------------------------------- 3:03 AM on 09/18/2021 -----------------------------------------  The patient reports no pain at this time and she has been feeling well since coming to the emergency department.  Her ultrasound was generally reassuring with an intrauterine gestation at about [redacted] weeks gestation and visible cardiac activity.  There is also a cystic structure that the radiologist feels could represent either a moderate to large subchorionic hemorrhage or an empty gestational sac.  I clarified with the patient that she is having no vaginal bleeding.  I also reassessed her abdominal exam and she has no tenderness to palpation of the epigastrium or right upper quadrant.  I considered whether or not to obtain a right upper quadrant ultrasound to evaluate for possible biliary colic, but given her normal LFTs and no tenderness to palpation on exam, I do not think it is necessary at this time.  To the best of my ability I explained the results of the ultrasound and that they are generally reassuring but that she should follow-up for prenatal care at the next available opportunity and that the OB/GYN can follow the cystic structure as her pregnancy develops.  She says that she understands and will follow-up as an outpatient.  I gave my usual and customary return precautions.   Loleta Rose, MD 09/18/21 [phone]

## 2021-09-17 NOTE — ED Provider Notes (Signed)
[facility] Provider Note    Event Date/Time   First MD Initiated Contact with Patient 09/17/21 2134     (approximate)   History   Abdominal Pain   HPI  Michele Love is a 21 y.o. female  g2P1 who is [redacted] weeks pregnant by LMP presents to the ER for right lower quadrant pain started about 30 minutes prior to arrival.  States she took a home pregnancy test 2 days ago and it was positive.  Does not have any vaginal bleeding or discharge.  No fevers.  Pain is currently resolved at this time.  Denies any chest pain or shortness of breath.      Physical Exam   Triage Vital Signs: ED Triage Vitals  Enc Vitals Group     BP 09/17/21 2119 108/73     Pulse Rate 09/17/21 2119 91     Resp 09/17/21 2119 15     Temp 09/17/21 2119 98.7 F (37.1 C)     Temp Source 09/17/21 2119 Oral     SpO2 09/17/21 2119 100 %     Weight 09/17/21 2120 116 lb (52.6 kg)     Height 09/17/21 2120 5' (1.524 m)     Head Circumference --      Peak Flow --      Pain Score 09/17/21 2120 3     Pain Loc --      Pain Edu? --      Excl. in GC? --     Most recent vital signs: Vitals:   09/17/21 2119  BP: 108/73  Pulse: 91  Resp: 15  Temp: 98.7 F (37.1 C)  SpO2: 100%     Constitutional: Alert  Eyes: Conjunctivae are normal.  Head: Atraumatic. Nose: No congestion/rhinnorhea. Mouth/Throat: Mucous membranes are moist.   Neck: Painless ROM.  Cardiovascular:   Good peripheral circulation. Respiratory: Normal respiratory effort.  No retractions.  Gastrointestinal: Soft and nontender in all four quadrants Musculoskeletal:  no deformity Neurologic:  MAE spontaneously. No gross focal neurologic deficits are appreciated.  Skin:  Skin is warm, dry and intact. No rash noted. Psychiatric: Mood and affect are normal. Speech and behavior are normal.    ED Results / Procedures / Treatments   Labs (all labs ordered are listed, but only abnormal results are displayed) Labs  Reviewed  COMPREHENSIVE METABOLIC PANEL - Abnormal; Notable for the following components:      Result Value   Sodium 134 (*)    Potassium 3.2 (*)    Glucose, Bld 105 (*)    Total Protein 8.2 (*)    Total Bilirubin 0.2 (*)    All other components within normal limits  HCG, QUANTITATIVE, PREGNANCY - Abnormal; Notable for the following components:   hCG, Beta Chain, Quant, S 174,458 (*)    All other components within normal limits  CBC  TYPE AND SCREEN     EKG     RADIOLOGY    PROCEDURES:  Critical Care performed: No  Procedures   MEDICATIONS ORDERED IN ED: Medications - No data to display   IMPRESSION / MDM / ASSESSMENT AND PLAN / ED COURSE  I reviewed the triage vital signs and the nursing notes.                              Differential diagnosis includes, but is not limited to, ectopic, cyst, uti, miscarriage, abruption, doubt appendicitis  Patient G2,  P1 presented to ER for episode of right lower quadrant abdominal discomfort in setting of early pregnancy.  She is currently pain-free.  No leukocytosis.  No guarding or rebound on exam.  Her abdominal exam is benign.  She is afebrile no tachycardia.  Blood will be sent for the but differential noted to have elevated hCG have ordered ultrasound to further evaluate.  Patient be signed out to oncoming physician pending follow-up ultrasound and reassessment.       FINAL CLINICAL IMPRESSION(S) / ED DIAGNOSES   Final diagnoses:  Abdominal pain, RLQ     Rx / DC Orders   ED Discharge Orders     None        Note:  This document was prepared using Dragon voice recognition software and may include unintentional dictation errors.    Willy Eddy, MD 09/17/21 2241

## 2021-09-17 NOTE — ED Triage Notes (Signed)
Pt presents to ER c/o RLQ abd pain that started around 30 minutes ago.  Pt describes pain as a "tightness."  Pt denies n/v/d since pain started.  Pt states she also took a positive home pregnancy test 2 days ago.  Pt has not been seen by OBGYN yet.  Denies vaginal bleeding.  Pt A&O x4 at this time in NAD.

## 2021-09-18 NOTE — Discharge Instructions (Signed)
As we discussed, your evaluation was reassuring tonight.  Your baby appears to be about 47 weeks old with a reassuring heart rate.  You have an area within your uterus which could be some bleeding, called a subchorionic hemorrhage, or it could be a different type of cyst or gestational sac that has not developed.  Either way, the OB/GYN that provides care for you during your pregnancy will be able to evaluate further as you progress in your pregnancy.  Right now there is no indication that there is a problem.  Please follow-up at the next available opportunity to establish prenatal care and start taking prenatal vitamins now.  Return to the emergency department if you develop new or worsening symptoms that concern you.

## 2021-11-30 ENCOUNTER — Ambulatory Visit: Payer: Medicaid Other | Admitting: Advanced Practice Midwife

## 2021-11-30 ENCOUNTER — Other Ambulatory Visit: Payer: Self-pay

## 2021-11-30 ENCOUNTER — Encounter: Payer: Self-pay | Admitting: Advanced Practice Midwife

## 2021-11-30 VITALS — BP 96/58 | HR 87 | Temp 97.5°F | Wt 97.2 lb

## 2021-11-30 DIAGNOSIS — Z23 Encounter for immunization: Secondary | ICD-10-CM

## 2021-11-30 DIAGNOSIS — O99012 Anemia complicating pregnancy, second trimester: Secondary | ICD-10-CM | POA: Insufficient documentation

## 2021-11-30 DIAGNOSIS — Z3482 Encounter for supervision of other normal pregnancy, second trimester: Secondary | ICD-10-CM | POA: Insufficient documentation

## 2021-11-30 DIAGNOSIS — O99019 Anemia complicating pregnancy, unspecified trimester: Secondary | ICD-10-CM

## 2021-11-30 DIAGNOSIS — O0930 Supervision of pregnancy with insufficient antenatal care, unspecified trimester: Secondary | ICD-10-CM

## 2021-11-30 DIAGNOSIS — O0932 Supervision of pregnancy with insufficient antenatal care, second trimester: Secondary | ICD-10-CM

## 2021-11-30 LAB — URINALYSIS
Bilirubin, UA: NEGATIVE
Glucose, UA: NEGATIVE
Ketones, UA: NEGATIVE
Nitrite, UA: NEGATIVE
Protein,UA: NEGATIVE
RBC, UA: NEGATIVE
Specific Gravity, UA: 1.015 (ref 1.005–1.030)
Urobilinogen, Ur: 0.2 mg/dL (ref 0.2–1.0)
pH, UA: 7.5 (ref 5.0–7.5)

## 2021-11-30 LAB — HEMOGLOBIN, FINGERSTICK: Hemoglobin: 10.7 g/dL — ABNORMAL LOW (ref 11.1–15.9)

## 2021-11-30 LAB — WET PREP FOR TRICH, YEAST, CLUE
Trichomonas Exam: NEGATIVE
Yeast Exam: NEGATIVE

## 2021-11-30 MED ORDER — IRON (FERROUS SULFATE) 325 (65 FE) MG PO TABS: 1.0000 | ORAL_TABLET | Freq: Every day | ORAL | 0 refills | Status: AC

## 2021-11-30 NOTE — Progress Notes (Signed)
[facility] Department  ?Maternal Health Clinic ? ? ?INITIAL PRENATAL VISIT NOTE ? ?Subjective:  ?Michele Love is a 21 y.o. G2P1001 at [redacted]w[redacted]d being seen today to start prenatal care at the [facility] Department.  She feels "good" about surprise pregnancy with no birth control. 21 yo employed FOB feels "good" about pregnancy and is the father of her daughter; in 3 year relationship but she is unsure if he will be supportive or not. She is not working, not in school, has no Management consultant, relies on others for transportation, and is living with her mom who does not work, and her 64 yo daughter. LMP 08/02/21. Last dental exam age 34. Has had 1 u/s this pregnancy on 09/17/21 8 2/7 when she went to ER for abdominal pain. Denies cigs, vaping, cigars, MJ, ETOH. 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 Pallidum neg; Late prenatal care @17  1/7; Prenatal care, subsequent pregnancy, second trimester; and Close interconceptual spacing 9 mo on their problem list. ? ?Patient reports no complaints.  Contractions: Not present. Vag. Bleeding: None.  Movement: Absent. Denies leaking of fluid.  ? ?Indications for ASA therapy (per uptodate) ?One of the following: ?Previous pregnancy with preeclampsia, especially early onset and with an adverse outcome No ?Multifetal gestation No ?Chronic hypertension No ?Type 1 or 2 diabetes mellitus No ?Chronic kidney disease No ?Autoimmune disease (antiphospholipid syndrome, systemic lupus erythematosus) No ? ?Two or more of the following: ?Nulliparity No ?Obesity (body mass index >30 kg/m2) No ?Family history of preeclampsia in mother or sister No ?Age ?35 years No ?Sociodemographic characteristics (African American race, low socioeconomic level) Yes ?Personal risk factors (eg, previous pregnancy with low birth weight or small for gestational age  infant, previous adverse pregnancy outcome [eg, stillbirth], interval >10 years between pregnancies) No ? ? ?The following portions of the patient's history were reviewed and updated as appropriate: allergies, current medications, past family history, past medical history, past social history, past surgical history and problem list. Problem list updated. ? ?Objective:  ? ?Vitals:  ? 11/30/21 1328  ?BP: (!) 96/58  ?Pulse: 87  ?Temp: (!) 97.5 ?F (36.4 ?C)  ?Weight: 97 lb 3.2 oz (44.1 kg)  ? ? ?Fetal Status: Fetal Heart Rate (bpm): 150 Fundal Height: 19 cm Movement: Absent  Presentation: Undeterminable ? ? ?Physical Exam ?Vitals and nursing note reviewed.  ?Constitutional:   ?   General: She is not in acute distress. ?   Appearance: Normal appearance. She is well-developed and normal weight.  ?HENT:  ?   Head: Normocephalic and atraumatic.  ?   Right Ear: External ear normal.  ?   Left Ear: External ear normal.  ?   Nose: Nose normal. No congestion or rhinorrhea.  ?   Mouth/Throat:  ?   Lips: Pink.  ?   Mouth: Mucous membranes are moist.  ?   Dentition: Normal dentition. No dental caries.  ?   Pharynx: Oropharynx is clear. Uvula midline.  ?   Comments: Dentition: fair  ?Last dental exam age 11 ?Eyes:  ?   General: No scleral icterus. ?   Conjunctiva/sclera: Conjunctivae normal.  ?Neck:  ?   Thyroid: No thyroid mass or thyromegaly.  ?Cardiovascular:  ?   Rate and Rhythm: Normal rate.  ?   Pulses: Normal pulses.  ?   Comments: Extremities are warm and well perfused ?Pulmonary:  ?  Effort: Pulmonary effort is normal.  ?   Breath sounds: Normal breath sounds.  ?Chest:  ?   Chest wall: No mass.  ?Breasts: ?   Tanner Score is 5.  ?   Breasts are symmetrical.  ?   Right: Normal. No mass, nipple discharge or skin change.  ?   Left: Normal. No mass, nipple discharge or skin change.  ?Abdominal:  ?   Palpations: Abdomen is soft.  ?   Tenderness: There is no abdominal tenderness.  ?   Comments: Gravid, soft without masses or  tenderness, FHR=150, FH 17 cm  ?Genitourinary: ?   General: Normal vulva.  ?   Exam position: Lithotomy position.  ?   Pubic Area: No rash.   ?   Labia:     ?   Right: No rash.     ?   Left: No rash.   ?   Vagina: Vaginal discharge (white creamy leukorrhea, ph<4.5) present.  ?   Cervix: No cervical motion tenderness or friability.  ?   Uterus: Normal. Enlarged (Gravid 17 wk size). Not tender.   ?   Adnexa: Right adnexa normal and left adnexa normal.  ?   Rectum: Normal. No external hemorrhoid.  ?Musculoskeletal:  ?   Right lower leg: No edema.  ?   Left lower leg: No edema.  ?Lymphadenopathy:  ?   Cervical: No cervical adenopathy.  ?   Upper Body:  ?   Right upper body: No axillary adenopathy.  ?   Left upper body: No axillary adenopathy.  ?Skin: ?   General: Skin is warm.  ?   Capillary Refill: Capillary refill takes less than 2 seconds.  ?Neurological:  ?   Mental Status: She is alert.  ? ? ?Assessment and Plan:  ?Pregnancy: G2P1001 at [redacted]w[redacted]d ? ?1. Late prenatal care @17  1/7 ? ? ?2. Prenatal care, subsequent pregnancy, second trimester ?Anatomy u/s ordered ?Declines genetic screening ?Counseled on weight gain of 25-35 lbs ?Please give dental list to pt ? ?- Urine Culture ?- Chlamydia/GC NAA, Confirmation ?- Lead, blood (adult age 21 yrs or greater) ?- HIV-1/HIV-2 Qualitative RNA ?- HCV Ab w Reflex to Quant PCR ?- Prenatal profile without Varicella/Rubella [id number]) ?[id number] Drug Screen ?- WET PREP FOR TRICH, YEAST, CLUE ?- Urinalysis (Urine Dip) ?- Hemoglobin, venipuncture ?- US OB Comp + 14 Wk; Future ? ?3. Close interconceptual spacing 9 mo ? ? ? ? ?Discussed overview of care and coordination with inpatient delivery practices including [facility], [facility], [facility] and [facility].  ? ?Reviewed Centering pregnancy as standard of care at [facility] ? ? ?Preterm labor symptoms and general obstetric precautions including but not limited to vaginal bleeding, contractions, leaking of fluid and fetal movement were  reviewed in detail with the patient. ? ?Please refer to After Visit Summary for other counseling recommendations.  ? ?Return in about 4 weeks (around 12/28/2021) for routine PNC. ? ?No future appointments. ? ?Herbie Saxon, CNM ? ?

## 2021-11-30 NOTE — Progress Notes (Signed)
Presents for initiation of prenatal care. 09/17/2021 to [facility] ED for evaluation of abdominal pain. [place] completed and in outguide for review. Per client, she has been taking no medicines, including prenatal vitamins. Note left for provider to please order (has Medicaid). Client with negative Quantiferon TB Gold test in 07/2020 and denies international travel since test completed. Counseled regarding Quad screen - declination form signed today. Jossie Ng, RN ?Wet prep and urine dip reviewed - no interventions required per standing order. Hgb = 10.7 and iron initiated per standing order. Anemia profile added to orders and verified with lab enough blood available to run test. [facility] anatomy US scheduled for 12/07/2021 at 1330. Client to arrive at 1300 and to have a full bladder (water) by 1330. Verified knows location of [facility] and appt reminder card for [place] given. Jossie Ng, RN ? ? ?

## 2021-12-01 LAB — CBC/D/PLT+RPR+RH+ABO+AB SCR
Antibody Screen: NEGATIVE
Hepatitis B Surface Ag: NEGATIVE
RPR Ser Ql: NONREACTIVE
Rh Factor: POSITIVE

## 2021-12-01 LAB — FE+CBC/D/PLT+TIBC+FER+RETIC
Basophils Absolute: 0 10*3/uL (ref 0.0–0.2)
Basos: 0 %
EOS (ABSOLUTE): 0.1 10*3/uL (ref 0.0–0.4)
Eos: 2 %
Ferritin: 18 ng/mL (ref 15–150)
Hematocrit: 30.6 % — ABNORMAL LOW (ref 34.0–46.6)
Hemoglobin: 10.6 g/dL — ABNORMAL LOW (ref 11.1–15.9)
Immature Grans (Abs): 0 10*3/uL (ref 0.0–0.1)
Immature Granulocytes: 0 %
Iron Saturation: 25 % (ref 15–55)
Iron: 86 ug/dL (ref 27–159)
Lymphocytes Absolute: 2.3 10*3/uL (ref 0.7–3.1)
Lymphs: 32 %
MCH: 29.4 pg (ref 26.6–33.0)
MCHC: 34.6 g/dL (ref 31.5–35.7)
MCV: 85 fL (ref 79–97)
Monocytes Absolute: 0.4 10*3/uL (ref 0.1–0.9)
Monocytes: 5 %
Neutrophils Absolute: 4.3 10*3/uL (ref 1.4–7.0)
Neutrophils: 61 %
Platelets: 296 10*3/uL (ref 150–450)
RBC: 3.61 x10E6/uL — ABNORMAL LOW (ref 3.77–5.28)
RDW: 13 % (ref 11.7–15.4)
Retic Ct Pct: 1.7 % (ref 0.6–2.6)
Total Iron Binding Capacity: 350 ug/dL (ref 250–450)
UIBC: 264 ug/dL (ref 131–425)
WBC: 7.1 10*3/uL (ref 3.4–10.8)

## 2021-12-01 LAB — LEAD, BLOOD (ADULT >= 16 YRS): Lead-Whole Blood: <1 ug/dL (ref 0.0–3.4)

## 2021-12-01 LAB — 789231 7+OXYCODONE-BUND
Amphetamines, Urine: NEGATIVE ng/mL
BENZODIAZ UR QL: NEGATIVE ng/mL
Barbiturate screen, urine: NEGATIVE ng/mL
Cannabinoid Quant, Ur: NEGATIVE ng/mL
Cocaine (Metab.): NEGATIVE ng/mL
OPIATE SCREEN URINE: NEGATIVE ng/mL
Oxycodone/Oxymorphone, Urine: NEGATIVE ng/mL
PCP Quant, Ur: NEGATIVE ng/mL

## 2021-12-01 LAB — HCV AB W REFLEX TO QUANT PCR: HCV Ab: NONREACTIVE

## 2021-12-01 LAB — HCV INTERPRETATION

## 2021-12-02 LAB — HIV-1/HIV-2 QUALITATIVE RNA
HIV-1 RNA, Qualitative: NONREACTIVE
HIV-2 RNA, Qualitative: NONREACTIVE

## 2021-12-02 LAB — URINE CULTURE: Organism ID, Bacteria: NO GROWTH

## 2021-12-03 LAB — CHLAMYDIA/GC NAA, CONFIRMATION
Chlamydia trachomatis, NAA: NEGATIVE
Neisseria gonorrhoeae, NAA: NEGATIVE

## 2021-12-07 ENCOUNTER — Ambulatory Visit
Admission: RE | Admit: 2021-12-07 | Discharge: 2021-12-07 | Disposition: A | Discharge status: Home / Self Care | Payer: Medicaid Other | Source: Ambulatory Visit | Attending: Advanced Practice Midwife | Admitting: Advanced Practice Midwife

## 2021-12-07 DIAGNOSIS — O321XX0 Maternal care for breech presentation, not applicable or unspecified: Secondary | ICD-10-CM | POA: Diagnosis not present

## 2021-12-07 DIAGNOSIS — Z3689 Encounter for other specified antenatal screening: Secondary | ICD-10-CM | POA: Insufficient documentation

## 2021-12-07 DIAGNOSIS — Z3A19 19 weeks gestation of pregnancy: Secondary | ICD-10-CM | POA: Diagnosis not present

## 2021-12-07 DIAGNOSIS — Z3482 Encounter for supervision of other normal pregnancy, second trimester: Secondary | ICD-10-CM | POA: Insufficient documentation

## 2021-12-12 ENCOUNTER — Encounter: Payer: Self-pay | Admitting: Advanced Practice Midwife

## 2021-12-12 DIAGNOSIS — Z3482 Encounter for supervision of other normal pregnancy, second trimester: Secondary | ICD-10-CM

## 2021-12-22 NOTE — Addendum Note (Signed)
Addended by: Cletis Media on: 12/22/2021 02:21 PM ? ? Modules accepted: Orders ? ?

## 2021-12-28 ENCOUNTER — Ambulatory Visit: Payer: Medicaid Other | Admitting: Family Medicine

## 2021-12-28 VITALS — BP 92/62 | HR 96 | Temp 98.0°F | Wt 101.2 lb

## 2021-12-28 DIAGNOSIS — Z3482 Encounter for supervision of other normal pregnancy, second trimester: Secondary | ICD-10-CM

## 2021-12-28 DIAGNOSIS — O99012 Anemia complicating pregnancy, second trimester: Secondary | ICD-10-CM

## 2021-12-28 LAB — HEMOGLOBIN, FINGERSTICK: Hemoglobin: 10.3 g/dL — ABNORMAL LOW (ref 11.1–15.9)

## 2021-12-28 NOTE — Progress Notes (Signed)
Here today for 22.6 week MH RV. Taking PNV and Iron QD. Hgb today 10.3. Instructed to continue Iron as directed. Denies ED/hospital visits since last RV. Tawny Hopping, RN ? ?

## 2021-12-28 NOTE — Progress Notes (Signed)
[place] [facility]  PRENATAL VISIT NOTE  Subjective:  Michele Love is a 21 y.o. G2P1001 at [redacted]w[redacted]d being seen today for ongoing prenatal care.  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 Pallidum neg; Late prenatal care @17  1/7; Prenatal care, subsequent pregnancy, second trimester; Close interconceptual spacing 9 mo; and Anemia affecting pregnancy in second trimester on their problem list.  Patient reports backache.  Contractions: Not present. Vag. Bleeding: None.  Movement: Present. Denies leaking of fluid/ROM.   The following portions of the patient's history were reviewed and updated as appropriate: allergies, current medications, past family history, past medical history, past social history, past surgical history and problem list. Problem list updated.  Objective:   Vitals:   12/28/21 1044  BP: 92/62  Pulse: 96  Temp: 98 F (36.7 C)  Weight: 101 lb 3.2 oz (45.9 kg)    Fetal Status: Fetal Heart Rate (bpm): 155 Fundal Height: 23 cm Movement: Present     General:  Alert, oriented and cooperative. Patient is in no acute distress.  Skin: Skin is warm and dry. No rash noted.   Cardiovascular: Normal heart rate noted  Respiratory: Normal respiratory effort, no problems with respiration noted  Abdomen: Soft, gravid, appropriate for gestational age.  Pain/Pressure: Absent     Pelvic: Cervical exam deferred        Extremities: Normal range of motion.  Edema: None  Mental Status: Normal mood and affect. Normal behavior. Normal judgment and thought content.   Assessment and Plan:  Pregnancy: G2P1001 at [redacted]w[redacted]d  1. Prenatal care, subsequent pregnancy, second trimester TWG 1 lbs  Taking PNV as directed  Discussed importane iof keeping 18 momths between pregnancies and reviewed types of BCM  Pt is undecided. Discussed  healthy eating habits and exercise during pregnancy.  -Discussed ligament pain and stretching exercises given.     - Hemoglobin, venipuncture  2. Anemia affecting pregnancy in second trimester Taking FeSO4 as directed  Hgb today was 10.3  Encourage to continue to take Iron as directed.   - Hemoglobin, venipuncture   Preterm labor symptoms and general obstetric precautions including but not limited to vaginal bleeding, contractions, leaking of fluid and fetal movement were reviewed in detail with the patient. Please refer to After Visit Summary for other counseling recommendations.  No follow-ups on file.  Future Appointments  Date Time Provider [place]  01/25/2022 11:00 AM [facility] PROVIDER [facility] None    Junious Dresser, FNP

## 2022-01-25 ENCOUNTER — Telehealth: Payer: Self-pay | Admitting: Family Medicine

## 2022-01-25 ENCOUNTER — Ambulatory Visit: Payer: Medicaid Other

## 2022-01-25 NOTE — Telephone Encounter (Signed)
Kent and rs missed appt.

## 2022-01-27 NOTE — Telephone Encounter (Signed)
Telephone call to patient regarding her missed MH RV on 01-25-2022 at 11 am.  Left a message to please call [phone] to reschedule as soon as possible.  Hart Carwin, RN

## 2022-01-30 NOTE — Telephone Encounter (Signed)
Rescheduled MH RV appointment for 02-06-22 at 8:40 am (8:20 arrival time).  Josephine Igo, RN assisted with appointment in Epic.  Hart Carwin, RN

## 2022-02-06 ENCOUNTER — Ambulatory Visit: Payer: Medicaid Other | Admitting: Advanced Practice Midwife

## 2022-02-06 ENCOUNTER — Telehealth: Payer: Self-pay

## 2022-02-06 VITALS — BP 89/57 | HR 78 | Temp 97.0°F | Wt 108.2 lb

## 2022-02-06 DIAGNOSIS — Z3482 Encounter for supervision of other normal pregnancy, second trimester: Secondary | ICD-10-CM | POA: Diagnosis not present

## 2022-02-06 DIAGNOSIS — O0930 Supervision of pregnancy with insufficient antenatal care, unspecified trimester: Secondary | ICD-10-CM

## 2022-02-06 DIAGNOSIS — Z3483 Encounter for supervision of other normal pregnancy, third trimester: Secondary | ICD-10-CM

## 2022-02-06 DIAGNOSIS — O99012 Anemia complicating pregnancy, second trimester: Secondary | ICD-10-CM

## 2022-02-06 DIAGNOSIS — O99013 Anemia complicating pregnancy, third trimester: Secondary | ICD-10-CM

## 2022-02-06 DIAGNOSIS — O0933 Supervision of pregnancy with insufficient antenatal care, third trimester: Secondary | ICD-10-CM

## 2022-02-06 DIAGNOSIS — Z91199 Patient's noncompliance with other medical treatment and regimen due to unspecified reason: Secondary | ICD-10-CM

## 2022-02-06 LAB — HEMOGLOBIN, FINGERSTICK: Hemoglobin: 11.2 g/dL (ref 11.1–15.9)

## 2022-02-06 NOTE — Progress Notes (Signed)
[facility]  PRENATAL VISIT NOTE  Subjective:  Michele Love is a 21 y.o. G2P1001 at [redacted]w[redacted]d being seen today for ongoing prenatal care.  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 Pallidum neg; Late prenatal care @17  1/7; Prenatal care, subsequent pregnancy, second trimester; Close interconceptual spacing 9 mo; Anemia affecting pregnancy in second trimester; and Noncompliance with prenatal care (no care x 6 wks: 22-28 wks) on their problem list.  Patient reports no complaints.   .  .  Movement: Present. Denies leaking of fluid/ROM.   The following portions of the patient's history were reviewed and updated as appropriate: allergies, current medications, past family history, past medical history, past social history, past surgical history and problem list. Problem list updated.  Objective:   Vitals:   02/06/22 0848  BP: (!) 89/57  Pulse: 78  Temp: (!) 97 F (36.1 C)  Weight: 108 lb 3.2 oz (49.1 kg)    Fetal Status: Fetal Heart Rate (bpm): 150 Fundal Height: 27 cm Movement: Present     General:  Alert, oriented and cooperative. Patient is in no acute distress.  Skin: Skin is warm and dry. No rash noted.   Cardiovascular: Normal heart rate noted  Respiratory: Normal respiratory effort, no problems with respiration noted  Abdomen: Soft, gravid, appropriate for gestational age.        Pelvic: Cervical exam deferred        Extremities: Normal range of motion.  Edema: None  Mental Status: Normal mood and affect. Normal behavior. Normal judgment and thought content.   Assessment and Plan:  Pregnancy: G2P1001 at [redacted]w[redacted]d  1. Prenatal care, subsequent pregnancy, second trimester Walking 5x/wk x 4 laps at the park with her daughter 8 lb 3.2 oz (3.719 kg) 1 hour glucola today Living with her mom and her 1 yo  daughter Not working Reviewed 12/07/21 u/s at 19 4/7 with anterior placenta, EFW=44%, anatomy wnl, AFI wnl UDS ordered for noncompliance - Glucose, 1 hour - RPR - HIV-1/HIV-2 Qualitative RNA - Hemoglobin, venipuncture - [id number] Drug Screen  2. Anemia affecting pregnancy in second trimester Taking FeSo4 I daily with oj  3. Close interconceptual spacing 9 mo   4. Late prenatal care @17  1/7   5. Noncompliance with prenatal care (no care x 6 wks) 22-28 wks, pt states she "forgot"   Preterm labor symptoms and general obstetric precautions including but not limited to vaginal bleeding, contractions, leaking of fluid and fetal movement were reviewed in detail with the patient. Please refer to After Visit Summary for other counseling recommendations.  Return in about 2 weeks (around 02/20/2022) for routine PNC.  Future Appointments  Date Time Provider Department Center  02/20/2022  8:40 AM [facility] PROVIDER [facility] None    Alberteen Spindle, CNM

## 2022-02-07 LAB — 789231 7+OXYCODONE-BUND
Amphetamines, Urine: NEGATIVE ng/mL
BENZODIAZ UR QL: NEGATIVE ng/mL
Barbiturate screen, urine: NEGATIVE ng/mL
Cannabinoid Quant, Ur: NEGATIVE ng/mL
Cocaine (Metab.): NEGATIVE ng/mL
OPIATE SCREEN URINE: NEGATIVE ng/mL
Oxycodone/Oxymorphone, Urine: NEGATIVE ng/mL
PCP Quant, Ur: NEGATIVE ng/mL

## 2022-02-08 LAB — HIV-1/HIV-2 QUALITATIVE RNA
HIV-1 RNA, Qualitative: NONREACTIVE
HIV-2 RNA, Qualitative: NONREACTIVE

## 2022-02-08 LAB — RPR: RPR Ser Ql: NONREACTIVE

## 2022-02-08 LAB — GLUCOSE, 1 HOUR GESTATIONAL: Gestational Diabetes Screen: 74 mg/dL (ref 70–139)

## 2022-02-20 ENCOUNTER — Telehealth: Payer: Self-pay | Admitting: Family Medicine

## 2022-02-20 ENCOUNTER — Ambulatory Visit: Payer: Medicaid Other

## 2022-02-20 NOTE — Telephone Encounter (Signed)
Called and rescheduled pt on 03/01/22.

## 2022-02-23 NOTE — Addendum Note (Signed)
Addended by: Santina Trillo on: 02/23/2022 12:43 PM   Modules accepted: Orders  

## 2022-03-01 ENCOUNTER — Ambulatory Visit: Payer: Medicaid Other | Admitting: Advanced Practice Midwife

## 2022-03-01 VITALS — BP 94/63 | HR 90 | Temp 97.0°F | Wt 113.2 lb

## 2022-03-01 DIAGNOSIS — O0930 Supervision of pregnancy with insufficient antenatal care, unspecified trimester: Secondary | ICD-10-CM

## 2022-03-01 DIAGNOSIS — Z91199 Patient's noncompliance with other medical treatment and regimen due to unspecified reason: Secondary | ICD-10-CM

## 2022-03-01 DIAGNOSIS — O99012 Anemia complicating pregnancy, second trimester: Secondary | ICD-10-CM

## 2022-03-01 DIAGNOSIS — Z3482 Encounter for supervision of other normal pregnancy, second trimester: Secondary | ICD-10-CM

## 2022-03-01 NOTE — Progress Notes (Signed)
Patient here for MH RV at 31 6/7. Kick counts reviewed and cards given.Burt Knack, RN

## 2022-03-01 NOTE — Progress Notes (Signed)
[facility] M[facility] Clinic  PRENATAL VISIT NOTE  Subjective:  Michele Love is a 21 y.o. G2P1001 at [redacted]w[redacted]d being seen today for ongoing prenatal care.  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 Pallidum neg; Late prenatal care @17  1/7; Prenatal care, subsequent pregnancy, second trimester; Close interconceptual spacing 9 mo; Anemia affecting pregnancy in second trimester; and Noncompliance with prenatal care (no care x 6 wks: 22-28 wks) on their problem list.  Patient reports no complaints.  Contractions: Not present. Vag. Bleeding: None.  Movement: Present. Denies leaking of fluid/ROM.   The following portions of the patient's history were reviewed and updated as appropriate: allergies, current medications, past family history, past medical history, past social history, past surgical history and problem list. Problem list updated.  Objective:   Vitals:   03/01/22 1520  BP: 94/63  Pulse: 90  Temp: (!) 97 F (36.1 C)  Weight: 113 lb 3.2 oz (51.3 kg)    Fetal Status: Fetal Heart Rate (bpm): 130 Fundal Height: 31 cm Movement: Present     General:  Alert, oriented and cooperative. Patient is in no acute distress.  Skin: Skin is warm and dry. No rash noted.   Cardiovascular: Normal heart rate noted  Respiratory: Normal respiratory effort, no problems with respiration noted  Abdomen: Soft, gravid, appropriate for gestational age.  Pain/Pressure: Absent     Pelvic: Cervical exam deferred        Extremities: Normal range of motion.  Edema: None  Mental Status: Normal mood and affect. Normal behavior. Normal judgment and thought content.   Assessment and Plan:  Pregnancy: G2P1001 at [redacted]w[redacted]d  1. Prenatal care, subsequent pregnancy, second trimester 13 lb 3.2 oz (5.987 kg) Walking 7x/wk with her daughter x 3 laps Not working 1 hour  glucola=02/06/22=74 UDS 02/06/22 neg 02/06/22 Hgb=11.2 Here with sister Has car seat  2. Noncompliance with prenatal care (no care x 6 wks: 22-28 wks) [facility] 02/20/22 apt and rescheduled for today  3. Anemia affecting pregnancy in second trimester Taking FeSo4 I daily with oj  4. Close interconceptual spacing 9 mo   5. Late prenatal care @17  1/7    Preterm labor symptoms and general obstetric precautions including but not limited to vaginal bleeding, contractions, leaking of fluid and fetal movement were reviewed in detail with the patient. Please refer to After Visit Summary for other counseling recommendations.  Return in about 2 weeks (around 03/15/2022) for routine PNC.  No future appointments.  3/7, CNM

## 2022-03-17 ENCOUNTER — Ambulatory Visit: Payer: Medicaid Other | Admitting: Advanced Practice Midwife

## 2022-03-17 VITALS — BP 104/63 | HR 87 | Temp 97.0°F | Wt 113.2 lb

## 2022-03-17 DIAGNOSIS — Z91199 Patient's noncompliance with other medical treatment and regimen due to unspecified reason: Secondary | ICD-10-CM

## 2022-03-17 DIAGNOSIS — O0930 Supervision of pregnancy with insufficient antenatal care, unspecified trimester: Secondary | ICD-10-CM

## 2022-03-17 DIAGNOSIS — O99012 Anemia complicating pregnancy, second trimester: Secondary | ICD-10-CM

## 2022-03-17 DIAGNOSIS — Z3482 Encounter for supervision of other normal pregnancy, second trimester: Secondary | ICD-10-CM

## 2022-03-17 NOTE — Progress Notes (Signed)
Patient here for MH RV at 34 1/7. Patient states she has kick count cards at home.Burt Knack, RN

## 2022-03-17 NOTE — Progress Notes (Signed)
[facility]  PRENATAL VISIT NOTE  Subjective:  Michele Love is a 21 y.o. G2P1001 at [redacted]w[redacted]d being seen today for ongoing prenatal care.  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 Pallidum neg; Late prenatal care @17  1/7; Prenatal care, subsequent pregnancy, second trimester; Close interconceptual spacing 9 mo; Anemia affecting pregnancy in second trimester; and Noncompliance with prenatal care (no care x 6 wks: 22-28 wks) on their problem list.  Patient reports no complaints.  Contractions: Not present. Vag. Bleeding: None.  Movement: Present. Denies leaking of fluid/ROM.   The following portions of the patient's history were reviewed and updated as appropriate: allergies, current medications, past family history, past medical history, past social history, past surgical history and problem list. Problem list updated.  Objective:   Vitals:   03/17/22 1042  BP: 104/63  Pulse: 87  Temp: (!) 97 F (36.1 C)  Weight: 113 lb 3.2 oz (51.3 kg)    Fetal Status: Fetal Heart Rate (bpm): 140 Fundal Height: 33 cm Movement: Present     General:  Alert, oriented and cooperative. Patient is in no acute distress.  Skin: Skin is warm and dry. No rash noted.   Cardiovascular: Normal heart rate noted  Respiratory: Normal respiratory effort, no problems with respiration noted  Abdomen: Soft, gravid, appropriate for gestational age.  Pain/Pressure: Absent     Pelvic: Cervical exam deferred        Extremities: Normal range of motion.  Edema: None  Mental Status: Normal mood and affect. Normal behavior. Normal judgment and thought content.   Assessment and Plan:  Pregnancy: G2P1001 at [redacted]w[redacted]d  1. Late prenatal care @17  1/7   2. Prenatal care, subsequent pregnancy, second trimester 13 lb 3.2 oz (5.987 kg) No weight gain in past 3  wks; hasn't eaten today yet (11:00). States has food to eat at home Here with 1 1/2 yo daughter Has crib and car seat for baby 3. Noncompliance with prenatal care (no care x 6 wks: 22-28 wks)   4. Close interconceptual spacing 9 mo   5. Anemia affecting pregnancy in second trimester Taking FeSo4 I daily with oj   Preterm labor symptoms and general obstetric precautions including but not limited to vaginal bleeding, contractions, leaking of fluid and fetal movement were reviewed in detail with the patient. Please refer to After Visit Summary for other counseling recommendations.  Return in about 2 weeks (around 03/31/2022) for routine PNC.  Future Appointments  Date Time Provider Department Center  03/31/2022 10:20 AM [facility] PROVIDER AC-[facility] None    04/02/2022, CNM

## 2022-03-31 ENCOUNTER — Telehealth: Payer: Self-pay

## 2022-03-31 ENCOUNTER — Ambulatory Visit: Payer: Medicaid Other

## 2022-03-31 NOTE — Telephone Encounter (Signed)
[facility] for MHC RV 03/31/2022 at EGA = 36 1/7. Client already has previously scheduled appt for RV on 04/07/2022, but due to EGA needs appt sooner than next Friday.  Call to client and left message requesting she call ASAP Monday am to reschedule today's missed appt. Left message that really needed to be seen prior to previosuly scheduled appt on 04/07/22 due to her advanced weeks of pregnancy. Number to call provided.

## 2022-04-03 NOTE — Telephone Encounter (Signed)
Return call by patient this afternoon.  She opted to move her MH RV from Friday 04-07-22 at 2 pm to Thursday 04-06-22 9:20 am (arrival time 9).  Friday appointment canceled.  Hart Carwin, RN

## 2022-04-03 NOTE — Telephone Encounter (Signed)
Telephone call to patient regarding her [facility] 03-31-22 and the need for an appointment before this Friday 04-07-22.  Message left today to please call for an appointment sooner that Friday the 25th that is already scheduled and there is availability this afternoon if she can go ahead and call this morning.  Number provided.  Hart Carwin, RN

## 2022-04-06 ENCOUNTER — Ambulatory Visit: Payer: Medicaid Other

## 2022-04-07 ENCOUNTER — Ambulatory Visit: Payer: Medicaid Other

## 2022-04-20 ENCOUNTER — Ambulatory Visit: Payer: Medicaid Other | Admitting: Physician Assistant

## 2022-04-20 ENCOUNTER — Encounter: Payer: Self-pay | Admitting: Physician Assistant

## 2022-04-20 VITALS — BP 95/54 | HR 104 | Temp 97.3°F | Wt 118.4 lb

## 2022-04-20 DIAGNOSIS — O99013 Anemia complicating pregnancy, third trimester: Secondary | ICD-10-CM

## 2022-04-20 DIAGNOSIS — Z3483 Encounter for supervision of other normal pregnancy, third trimester: Secondary | ICD-10-CM | POA: Diagnosis not present

## 2022-04-20 DIAGNOSIS — Z3482 Encounter for supervision of other normal pregnancy, second trimester: Secondary | ICD-10-CM

## 2022-04-20 DIAGNOSIS — Z3400 Encounter for supervision of normal first pregnancy, unspecified trimester: Secondary | ICD-10-CM | POA: Diagnosis not present

## 2022-04-20 DIAGNOSIS — O99012 Anemia complicating pregnancy, second trimester: Secondary | ICD-10-CM

## 2022-04-20 NOTE — Progress Notes (Signed)
Patient here for MH RV at 39w 0d.   Patient handed 36 week packet. Phq9 = 2 and abuse screen negative.   Patient desires provider to collect 36 wk cultures.   Earlyne Iba, RN

## 2022-04-20 NOTE — Progress Notes (Signed)
[facility]  PRENATAL VISIT NOTE  Subjective:  Michele Love is a 21 y.o. G2P1001 at [redacted]w[redacted]d being seen today for ongoing prenatal care.  She is currently monitored for the following issues for this high-risk pregnancy and has Biological false-positive (BFP) syphilis serology test on 07/14/20 1:1 but T. Pallidum neg; Late prenatal care @17  1/7; Prenatal care, subsequent pregnancy, second trimester; Close interconceptual spacing 9 mo; Anemia affecting pregnancy in second trimester; and Noncompliance with prenatal care (no care x 6 wks: 22-28 wks) on their problem list.  Patient reports occasional contractions.  Contractions: Irregular. Vag. Bleeding: None.  Movement: Present. Most recent contraction was yesterday. Denies leaking of fluid/ROM.   The following portions of the patient's history were reviewed and updated as appropriate: allergies, current medications, past family history, past medical history, past social history, past surgical history and problem list. Problem list updated.  Objective:   Vitals:   04/20/22 1521  BP: (!) 95/54  Pulse: (!) 104  Temp: (!) 97.3 F (36.3 C)  Weight: 118 lb 6.4 oz (53.7 kg)    Fetal Status: Fetal Heart Rate (bpm): 144 Fundal Height: 37 cm Movement: Present  Presentation: Vertex  General:  Alert, oriented and cooperative. Patient is in no acute distress.  Skin: Skin is warm and dry. No rash noted.   Cardiovascular: Normal heart rate noted  Respiratory: Normal respiratory effort, no problems with respiration noted  Abdomen: Soft, gravid, appropriate for gestational age.  Pain/Pressure: Absent     Pelvic: Cervical exam performed Dilation: Fingertip Effacement (%): 40    Extremities: Normal range of motion.  Edema: None  Mental Status: Normal mood and affect. Normal behavior. Normal judgment and thought content.   Assessment and Plan:  Pregnancy: G2P1001 at [redacted]w[redacted]d  1. Supervision of pregnancy,  antepartum Routine testing today. Cervix check done. Discussed option for IOL at 41-42 wk, pt declines for now. Enc po liquids, esp water in light of very hot weather.  - Culture, beta strep (group b only) - Chlamydia/GC NAA, Confirmation  2. Prenatal care, subsequent pregnancy, second trimester Continue weekly visits. Has infant carseat and crib. Feels ready for baby.  3. Anemia affecting pregnancy in second trimester Continue oral iron.   Term labor symptoms and general obstetric precautions including but not limited to vaginal bleeding, contractions, leaking of fluid and fetal movement were reviewed in detail with the patient. Please refer to After Visit Summary for other counseling recommendations.  Return in about 1 week (around 04/27/2022) for Routine prenatal care.  No future appointments.  04/29/2022, PA-C

## 2022-04-23 LAB — CHLAMYDIA/GC NAA, CONFIRMATION
Chlamydia trachomatis, NAA: NEGATIVE
Neisseria gonorrhoeae, NAA: NEGATIVE

## 2022-04-24 LAB — CULTURE, BETA STREP (GROUP B ONLY): Strep Gp B Culture: NEGATIVE

## 2022-04-25 ENCOUNTER — Other Ambulatory Visit: Payer: Self-pay

## 2022-04-25 ENCOUNTER — Encounter: Payer: Self-pay | Admitting: Certified Nurse Midwife

## 2022-04-25 ENCOUNTER — Inpatient Hospital Stay
Admission: EM | Admit: 2022-04-25 | Discharge: 2022-04-26 | DRG: 807 | Disposition: A | Discharge status: Home / Self Care | Payer: Medicaid Other | Attending: Certified Nurse Midwife | Admitting: Certified Nurse Midwife

## 2022-04-25 DIAGNOSIS — Z3A39 39 weeks gestation of pregnancy: Secondary | ICD-10-CM | POA: Diagnosis not present

## 2022-04-25 DIAGNOSIS — O26893 Other specified pregnancy related conditions, third trimester: Secondary | ICD-10-CM | POA: Diagnosis not present

## 2022-04-25 DIAGNOSIS — Z3403 Encounter for supervision of normal first pregnancy, third trimester: Secondary | ICD-10-CM | POA: Diagnosis not present

## 2022-04-25 DIAGNOSIS — O9902 Anemia complicating childbirth: Secondary | ICD-10-CM | POA: Diagnosis not present

## 2022-04-25 LAB — CBC
HCT: 38.9 % (ref 36.0–46.0)
Hemoglobin: 12.8 g/dL (ref 12.0–15.0)
MCH: 28.2 pg (ref 26.0–34.0)
MCHC: 32.9 g/dL (ref 30.0–36.0)
MCV: 85.7 fL (ref 80.0–100.0)
Platelets: 250 10*3/uL (ref 150–400)
RBC: 4.54 MIL/uL (ref 3.87–5.11)
RDW: 13.9 % (ref 11.5–15.5)
WBC: 9.3 10*3/uL (ref 4.0–10.5)
nRBC: 0 % (ref 0.0–0.2)

## 2022-04-25 LAB — TYPE AND SCREEN
ABO/RH(D): O POS
Antibody Screen: NEGATIVE

## 2022-04-25 IMAGING — US US OB COMP +14 WK
1 series · 14 of 28 positions shown · non-contrast
Comparison: none

CLINICAL DATA: Pregnancy.  Fetal anatomy evaluation.  Late to care.

EXAM:
OBSTETRICAL ULTRASOUND >14 WKS AND TRANSVAGINAL OB US

[Series 1: us ob comp +14 wk · 0.25mm/px · 90 acquisitions, 14 frames shown]
[im 4/90]
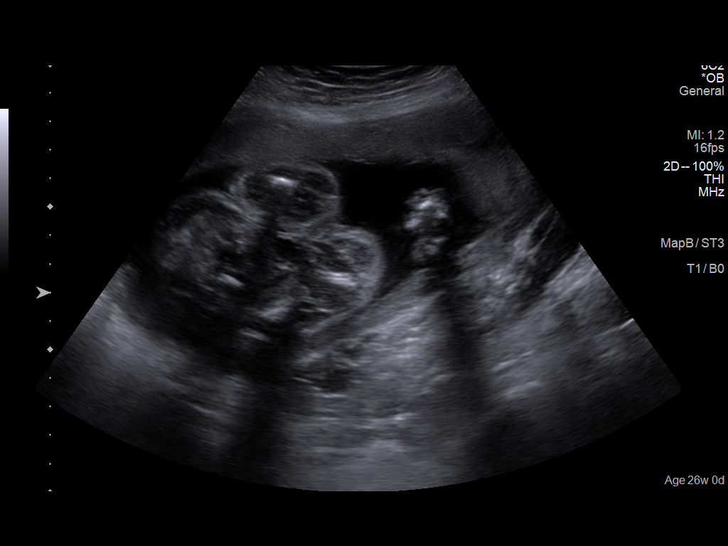
[im 10/90]
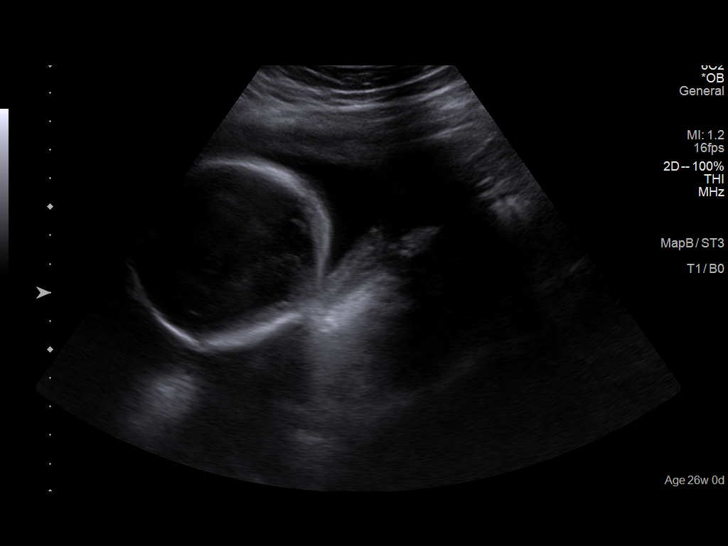
[im 17/90]
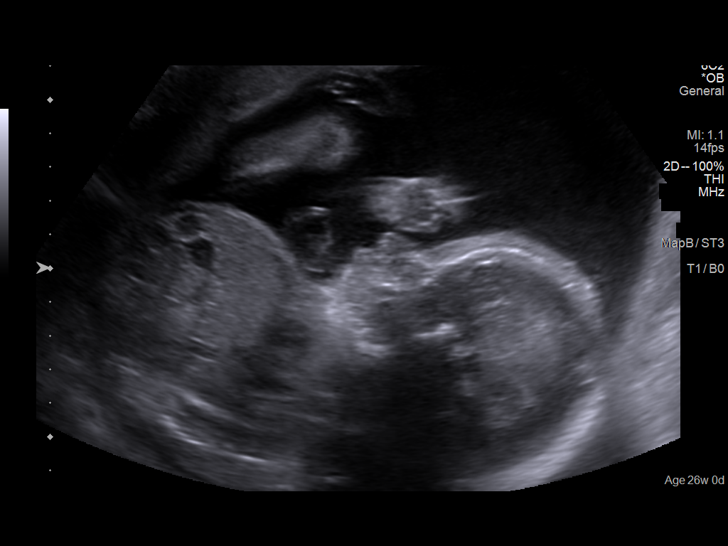
[im 24/90]
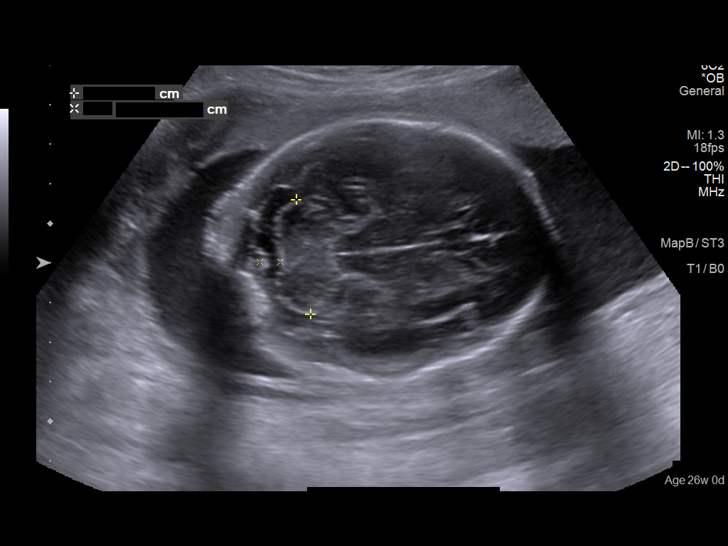
[im 30/90]
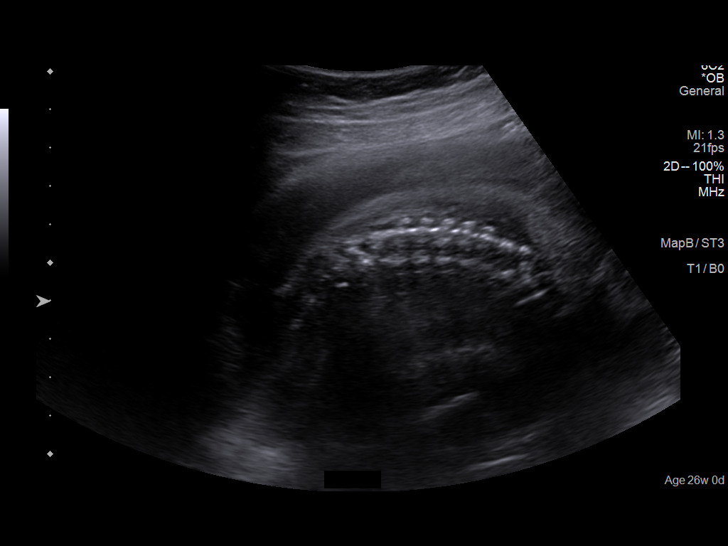
[im 37/90]
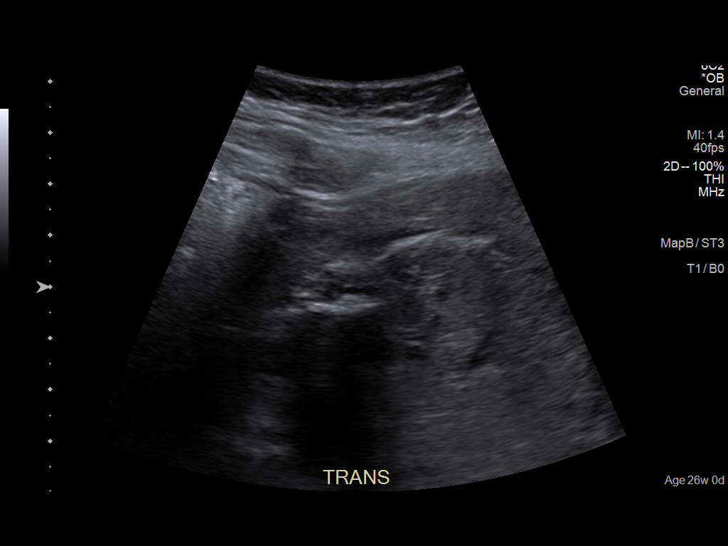
[im 43/90]
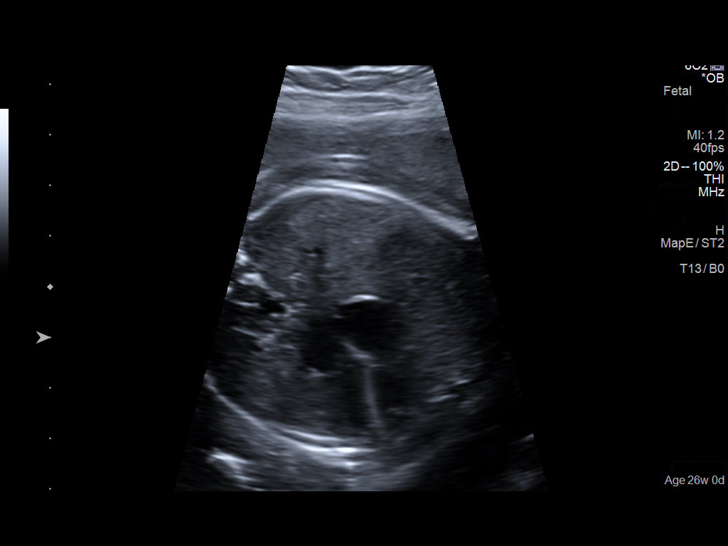
[im 50/90]
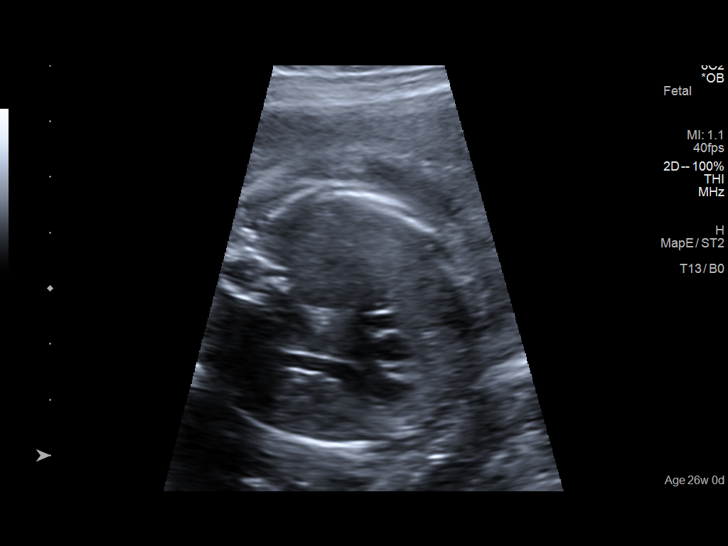
[im 57/90]
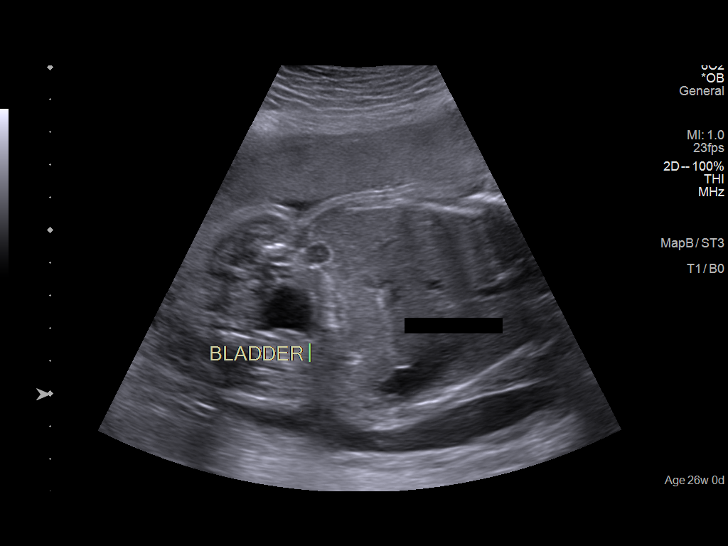
[im 63/90]
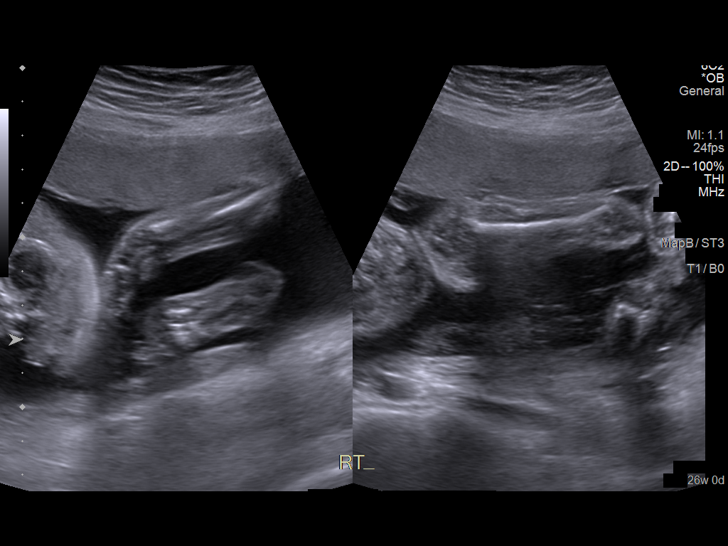
[im 70/90]
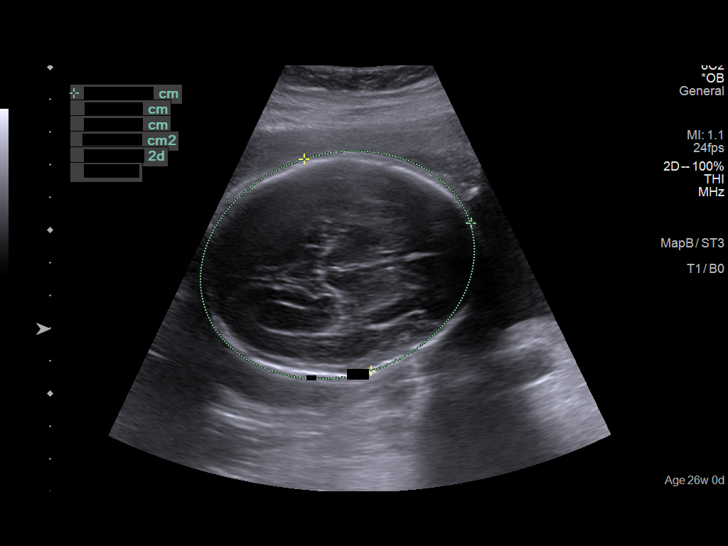
[im 76/90]
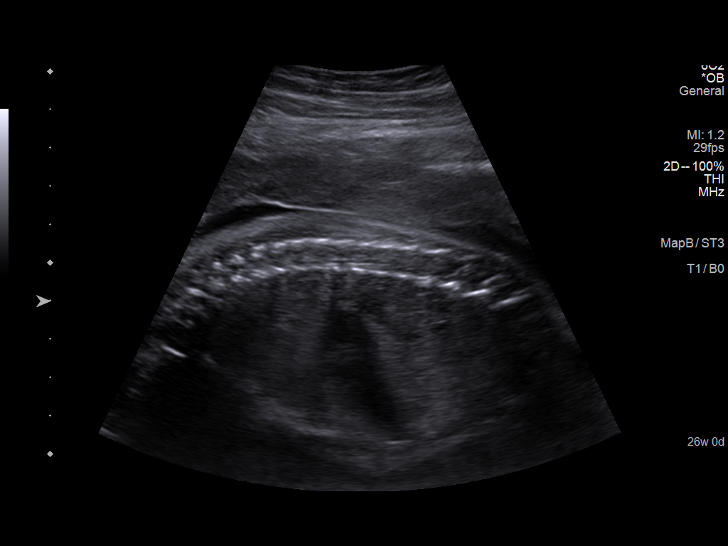
[im 83/90]
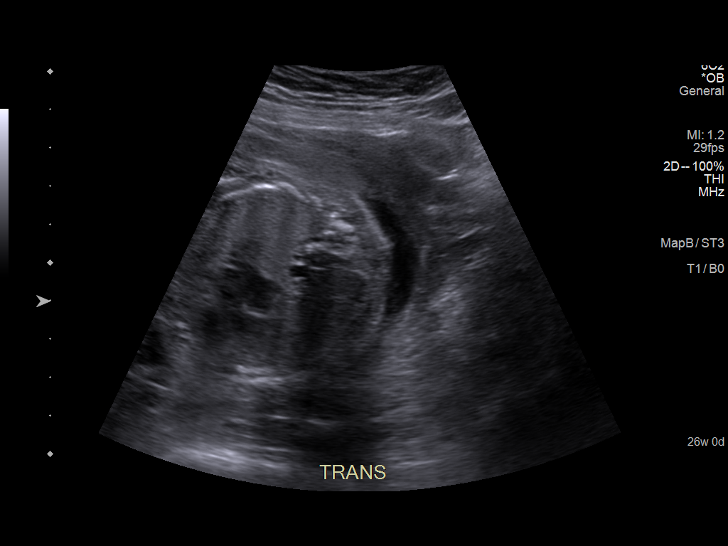
[im 90/90]
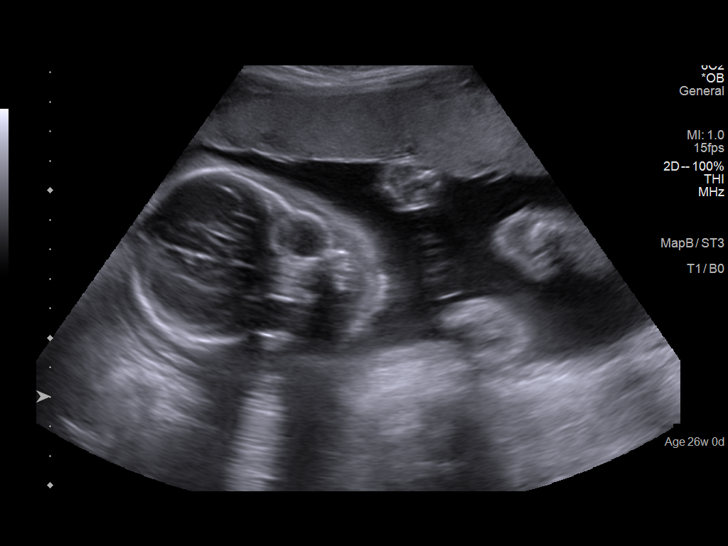

[14 of 28 positions shown; findings below may reference images not displayed]

FINDINGS: Number of Fetuses: 1

Heart Rate: 149 bpm

Movement: Present

Presentation: Variable

Previa: None

Placental Location: Anterior

Amniotic Fluid (Subjective): Normal

Amniotic Fluid (Objective):

Vertical pocket 5.2cm

FETAL BIOMETRY

BPD:  6.7cm 26w 6d

HC:    24.2cm 26w 2d

AC:    21.8cm 26w 2d

FL:    4.8cm 26w 0d

Current Mean GA: 26w 2d US EDC: 10/26/2020

FETAL ANATOMY

Lateral Ventricles: Appears normal

Thalami/CSP: Appears normal

Posterior Fossa: Appears normal

Nuchal Region: Appears normal

Upper Lip: Appears normal

Spine: Appears normal

4 Chamber Heart on Left: Appears normal

LVOT: Appears normal

RVOT: Appears normal appears normal

Stomach on Left: Appears normal

3 Vessel Cord: Appears normal

Cord Insertion site: Appears normal

Kidneys: Appears normal

Bladder: Appears normal

Extremities: Appears normal

Maternal Findings:

Cervix:  4.7 cm
IMPRESSION: Single viable intrauterine pregnancy at 26 weeks 2 days. Variable
presentation.

## 2022-04-25 MED ORDER — OXYTOCIN-SODIUM CHLORIDE 30-0.9 UT/500ML-% IV SOLN
2.5000 [IU]/h | INTRAVENOUS | Status: DC
  Administered 2022-04-25: 2.5 [IU]/h via INTRAVENOUS
  Filled 2022-04-25: qty 500

## 2022-04-25 MED ORDER — METHYLERGONOVINE MALEATE 0.2 MG PO TABS: 0.2000 mg | ORAL_TABLET | ORAL | Status: DC | PRN

## 2022-04-25 MED ORDER — BENZOCAINE-MENTHOL 20-0.5 % EX AERO: 1.0000 | INHALATION_SPRAY | CUTANEOUS | Status: DC | PRN

## 2022-04-25 MED ORDER — FENTANYL CITRATE (PF) 100 MCG/2ML IJ SOLN: 50.0000 ug | INTRAMUSCULAR | Status: DC | PRN

## 2022-04-25 MED ORDER — OXYCODONE-ACETAMINOPHEN 5-325 MG PO TABS: 2.0000 | ORAL_TABLET | ORAL | Status: DC | PRN

## 2022-04-25 MED ORDER — ONDANSETRON HCL 4 MG/2ML IJ SOLN: 4.0000 mg | Freq: Four times a day (QID) | INTRAMUSCULAR | Status: DC | PRN

## 2022-04-25 MED ORDER — SIMETHICONE 80 MG PO CHEW: 80.0000 mg | CHEWABLE_TABLET | ORAL | Status: DC | PRN

## 2022-04-25 MED ORDER — ACETAMINOPHEN 325 MG PO TABS
650.0000 mg | ORAL_TABLET | ORAL | Status: DC | PRN
  Administered 2022-04-26 (×2): 650 mg via ORAL
  Filled 2022-04-25 (×2): qty 2

## 2022-04-25 MED ORDER — IBUPROFEN 600 MG PO TABS
600.0000 mg | ORAL_TABLET | Freq: Four times a day (QID) | ORAL | Status: DC
  Administered 2022-04-26 (×4): 600 mg via ORAL
  Filled 2022-04-25 (×4): qty 1

## 2022-04-25 MED ORDER — WITCH HAZEL-GLYCERIN EX PADS: 1.0000 | MEDICATED_PAD | CUTANEOUS | Status: DC | PRN

## 2022-04-25 MED ORDER — SOD CITRATE-CITRIC ACID 500-334 MG/5ML PO SOLN: 30.0000 mL | ORAL | Status: DC | PRN

## 2022-04-25 MED ORDER — DIBUCAINE (PERIANAL) 1 % EX OINT: 1.0000 | TOPICAL_OINTMENT | CUTANEOUS | Status: DC | PRN

## 2022-04-25 MED ORDER — DOCUSATE SODIUM 100 MG PO CAPS
100.0000 mg | ORAL_CAPSULE | Freq: Two times a day (BID) | ORAL | Status: DC
  Administered 2022-04-26: 100 mg via ORAL
  Filled 2022-04-25: qty 1

## 2022-04-25 MED ORDER — FERROUS SULFATE 325 (65 FE) MG PO TABS
325.0000 mg | ORAL_TABLET | Freq: Every day | ORAL | Status: DC
  Administered 2022-04-26: 325 mg via ORAL
  Filled 2022-04-25: qty 1

## 2022-04-25 MED ORDER — PRENATAL MULTIVITAMIN CH
1.0000 | ORAL_TABLET | Freq: Every day | ORAL | Status: DC
  Administered 2022-04-26: 1 via ORAL
  Filled 2022-04-25: qty 1

## 2022-04-25 MED ORDER — ONDANSETRON HCL 4 MG/2ML IJ SOLN: 4.0000 mg | INTRAMUSCULAR | Status: DC | PRN

## 2022-04-25 MED ORDER — OXYTOCIN BOLUS FROM INFUSION
333.0000 mL | Freq: Once | INTRAVENOUS | Status: AC
  Administered 2022-04-25: 333 mL via INTRAVENOUS

## 2022-04-25 MED ORDER — SENNOSIDES-DOCUSATE SODIUM 8.6-50 MG PO TABS: 2.0000 | ORAL_TABLET | ORAL | Status: DC

## 2022-04-25 MED ORDER — LIDOCAINE HCL (PF) 1 % IJ SOLN: 30.0000 mL | INTRAMUSCULAR | Status: DC | PRN

## 2022-04-25 MED ORDER — ONDANSETRON HCL 4 MG PO TABS: 4.0000 mg | ORAL_TABLET | ORAL | Status: DC | PRN

## 2022-04-25 MED ORDER — LACTATED RINGERS IV SOLN: 500.0000 mL | INTRAVENOUS | Status: DC | PRN

## 2022-04-25 MED ORDER — LACTATED RINGERS IV SOLN: INTRAVENOUS | Status: DC

## 2022-04-25 MED ORDER — COCONUT OIL OIL: 1.0000 | TOPICAL_OIL | Status: DC | PRN

## 2022-04-25 MED ORDER — OXYCODONE-ACETAMINOPHEN 5-325 MG PO TABS: 1.0000 | ORAL_TABLET | ORAL | Status: DC | PRN

## 2022-04-25 MED ORDER — METHYLERGONOVINE MALEATE 0.2 MG/ML IJ SOLN: 0.2000 mg | INTRAMUSCULAR | Status: DC | PRN

## 2022-04-25 NOTE — Discharge Summary (Signed)
Postpartum Discharge Summary  Date of Service updated: 04/26/2022     Patient Name: Michele Love DOB: 2000-09-29 MRN: [id number]  Date of admission: 04/25/2022 Delivery date:04/25/2022  Delivering provider: Philip Aspen  Date of discharge: 04/26/2022  Admitting diagnosis: Labor and delivery, indication for care [O75.9] Intrauterine pregnancy: [redacted]w[redacted]d    Secondary diagnosis:  Active Problems:   Labor and delivery, indication for care   [redacted] weeks gestation of pregnancy   Postpartum care following vaginal delivery   Encounter for care or examination of lactating mother  Additional problems: none    Discharge diagnosis: Term Pregnancy Delivered                                              Post partum procedures: none Augmentation: N/A Complications: None  Hospital course: Onset of Labor With Vaginal Delivery      21y.o. yo G2P1001 at 38w5das admitted in Active Labor on 04/25/2022. Patient had an uncomplicated labor course as follows:  Membrane Rupture Time/Date: 8:15 PM ,04/25/2022   Delivery Method:Vaginal, Spontaneous  Episiotomy: None  Lacerations:  None   Patient had an uncomplicated postpartum course.  She is ambulating, tolerating a regular diet, passing flatus, and urinating well. She reports breastfeeding is going well.  Patient is discharged home in stable condition on 04/26/22.  Newborn Data: Birth date:04/25/2022  Birth time:8:19 PM  Gender:Female  Living status:Living  Apgars:8 ,9  Weight:2860 g   Magnesium Sulfate received: No BMZ received: No Rhophylac:No MMR:N/A T-DaP:Given postpartum Flu: N/A Transfusion:No  Physical exam  Vitals:   04/26/22 0100 04/26/22 0321 04/26/22 0847 04/26/22 1134  BP: 115/68 100/64 (!) 103/56 (!) 104/59  Pulse: (!) 105 92 81 81  Resp: 17 18 18 20   Temp: 98.1 F (36.7 C) 98.3 F (36.8 C) 98 F (36.7 C) 98 F (36.7 C)  TempSrc: Oral  Oral   SpO2: 97% 98% 99% 99%  Weight:      Height:       General:  alert, cooperative, and no distress Lochia: appropriate Uterine Fundus: firm Incision: N/A DVT Evaluation: No evidence of DVT seen on physical exam. Labs: Lab Results  Component Value Date   WBC 10.8 (H) 04/26/2022   HGB 11.1 (L) 04/26/2022   HCT 32.8 (L) 04/26/2022   MCV 86.5 04/26/2022   PLT 228 04/26/2022      Latest Ref Rng & Units 09/17/2021    9:29 PM  CMP  Glucose 70 - 99 mg/dL 105   BUN 6 - 20 mg/dL 9   Creatinine 0.44 - 1.00 mg/dL 0.49   Sodium 135 - 145 mmol/L 134   Potassium 3.5 - 5.1 mmol/L 3.2   Chloride 98 - 111 mmol/L 105   CO2 22 - 32 mmol/L 24   Calcium 8.9 - 10.3 mg/dL 9.2   Total Protein 6.5 - 8.1 g/dL 8.2   Total Bilirubin 0.3 - 1.2 mg/dL 0.2   Alkaline Phos 38 - 126 U/L 50   AST 15 - 41 U/L 27   ALT 0 - 44 U/L 23    Edinburgh Score:    04/26/2022   11:20 AM  Edinburgh Postnatal Depression Scale Screening Tool  I have been able to laugh and see the funny side of things. 0  I have looked forward with enjoyment to things. 0  I have blamed  myself unnecessarily when things went wrong. 0  I have been anxious or worried for no good reason. 0  I have felt scared or panicky for no good reason. 0  Things have been getting on top of me. 0  I have been so unhappy that I have had difficulty sleeping. 0  I have felt sad or miserable. 0  I have been so unhappy that I have been crying. 0  The thought of harming myself has occurred to me. 0  Edinburgh Postnatal Depression Scale Total 0      After visit meds:  Allergies as of 04/26/2022   No Known Allergies      Medication List     TAKE these medications    Iron (Ferrous Sulfate) 325 (65 Fe) MG Tabs Take 1 tablet by mouth daily at 6 (six) AM.   multivitamin-prenatal 27-0.8 MG Tabs tablet Take 1 tablet by mouth daily at 12 noon.         Discharge home in stable condition Infant Feeding: Breast Infant Disposition:home with mother Discharge instruction: per After Visit Summary and Postpartum  booklet. Activity: Advance as tolerated. Pelvic rest for 6 weeks.  Diet: routine diet Anticipated Birth Control:  discuss at postpartum visit Postpartum Appointment:6 weeks Additional Postpartum F/U:  none Future Appointments: Future Appointments  Date Time Provider [place]  04/28/2022  1:20 PM AC-MH PROVIDER AC-[facility] None   Follow up Visit:  [place] DEPT Follow up in 6 week(s).   Why: postpartum visit/birth control Contact information: [place] [id number] [place], CNM [facility] 04/26/2022, 4:25 PM

## 2022-04-25 NOTE — H&P (Signed)
History and Physical   HPI  Michele Love is a 21 y.o. G2P1001 at [redacted]w[redacted]d Estimated Date of Delivery: 04/27/22 who is being admitted for labor management   OB History  OB History  Gravida Para Term Preterm AB Living  2 1 1  0 0 1  SAB IAB Ectopic Multiple Live Births  0 0 0 0 1    # Outcome Date GA Lbr Len/2nd Weight Sex Delivery Anes PTL Lv  2 Current           1 Term 10/25/20 [redacted]w[redacted]d / 00:09 2680 g F Vag-Spont None  LIV     Name: Michele Love     Apgar1: 9  Apgar5: 9    PROBLEM LIST  Pregnancy complications or risks: Patient Active Problem List   Diagnosis Date Noted   Labor and delivery, indication for care 04/25/2022   Noncompliance with prenatal care (no care x 6 wks: 22-28 wks) 02/06/2022   Late prenatal care @17  1/7 11/30/2021   Prenatal care, subsequent pregnancy, second trimester 11/30/2021   Close interconceptual spacing 9 mo 11/30/2021   Anemia affecting pregnancy in second trimester 11/30/2021   Biological false-positive (BFP) syphilis serology test on 07/14/20 1:1 but T. Pallidum neg 07/20/2020    Prenatal labs and studies: ABO, Rh: O/Positive/-- (04/19 1545) Antibody: Negative (04/19 1545) Rubella:    RPR: Non Reactive (06/26 0952)  HBsAg: Negative (04/19 1545)  HIV:   negative 04[birth year]1990-- (09/07 1546)   Past Medical History:  Diagnosis Date   Anemia affecting pregnancy, antepartum 07/14/2020   Dizziness    Headache    Patient denies medical problems      Past Surgical History:  Procedure Laterality Date   denies     NO PAST SURGERIES       Medications    Current Discharge Medication List     CONTINUE these medications which have NOT CHANGED   Details  Iron, Ferrous Sulfate, 325 (65 Fe) MG TABS Take 1 tablet by mouth daily at 6 (six) AM. Qty: 100 tablet, Refills: 0   Associated Diagnoses: Anemia affecting pregnancy in second trimester    Prenatal Vit-Fe Fumarate-FA (MULTIVITAMIN-PRENATAL) 27-0.8 MG TABS  tablet Take 1 tablet by mouth daily at 12 noon.         Allergies  Patient has no known allergies.  Review of Systems  Constitutional: negative Eyes: negative Ears, nose, mouth, throat, and face: negative Respiratory: negative Cardiovascular: negative Gastrointestinal: negative Genitourinary:negative Integument/breast: negative Hematologic/lymphatic: negative Musculoskeletal:negative Neurological: negative Behavioral/Psych: negative Endocrine: negative Allergic/Immunologic: negative  Physical Exam  BP 117/64 (BP Location: Left Arm)   Pulse 66   Temp 98.9 F (37.2 C) (Oral)   Resp 16   LMP 08/02/2021 (Approximate)   Lungs:  CTA B Cardio: RRR without M/R/G Abd: Soft, gravid, NT Presentation: cephalic EXT: No C/C/ 1+ Edema DTRs: 2+ B CERVIX:    See Prenatal records for more detailed PE.     FHR:  Baseline: 150 bpm, Variability: Good {> 6 bpm), Accelerations: Reactive, and Decelerations: Early  Toco: Uterine Contractions: Frequency: Every 1-3 minutes  Test Results  No results found for this or any previous visit (from the past 24 hour(s)). Group B Strep negative  Assessment   G2P1001 at [redacted]w[redacted]d Estimated Date of Delivery: 04/27/22  The fetus is reassuring.   Patient Active Problem List   Diagnosis Date Noted   Labor and delivery, indication for care 04/25/2022   Noncompliance with prenatal care (no care x  6 wks: 22-28 wks) 02/06/2022   Late prenatal care @17  1/7 11/30/2021   Prenatal care, subsequent pregnancy, second trimester 11/30/2021   Close interconceptual spacing 9 mo 11/30/2021   Anemia affecting pregnancy in second trimester 11/30/2021   Biological false-positive (BFP) syphilis serology test on 07/14/20 1:1 but T. Pallidum neg 07/20/2020    Plan  1. Admit to L&D :   2. EFM:-- Category 1 3. IV pain medication or Epidural if desired.   4. Admission labs  5. Anticipate NSVD 6. Dr. 14/02/2020 notified of admission   Feliberto Gottron,  Doreene Burke  04/25/2022 7:59 PM

## 2022-04-25 NOTE — OB Triage Note (Signed)
Pt presents to OBS 3 c contractions that she states is q2-3 mins. She is rating the pain of these contractions a 8/10. Pt says contractions started at 1000 this morning (04/25/22) and got more painful around 1500 today (04/25/22). Patient denies any leaking of fluid or bloody show. Cxts are currently moderate strength upon palpation.

## 2022-04-26 DIAGNOSIS — Z3A39 39 weeks gestation of pregnancy: Secondary | ICD-10-CM

## 2022-04-26 LAB — CBC
HCT: 32.8 % — ABNORMAL LOW (ref 36.0–46.0)
Hemoglobin: 11.1 g/dL — ABNORMAL LOW (ref 12.0–15.0)
MCH: 29.3 pg (ref 26.0–34.0)
MCHC: 33.8 g/dL (ref 30.0–36.0)
MCV: 86.5 fL (ref 80.0–100.0)
Platelets: 228 10*3/uL (ref 150–400)
RBC: 3.79 MIL/uL — ABNORMAL LOW (ref 3.87–5.11)
RDW: 13.9 % (ref 11.5–15.5)
WBC: 10.8 10*3/uL — ABNORMAL HIGH (ref 4.0–10.5)
nRBC: 0 % (ref 0.0–0.2)

## 2022-04-26 LAB — RPR: RPR Ser Ql: NONREACTIVE

## 2022-04-26 NOTE — Lactation Note (Signed)
This note was copied from a baby's chart. Lactation Consultation Note  Patient Name: Michele Love SLPNP'Y Date: 04/26/2022 Reason for consult: Initial assessment;Term Age:22 hours  Maternal Data This is mom's 2nd baby, SVD. Mom with history of anemia. Mom combination fed, breast and bottle for 1 month and after 1 month soley bottle fed. Mom would like to breastfeed this baby but is not opposed to formula use if necessary. Has patient been taught Hand Expression?: Yes Does the patient have breastfeeding experience prior to this delivery?: Yes How long did the patient breastfeed?: 1 month  Feeding Mother's Current Feeding Choice: Breast Milk Observed mom independently breastfeeding. Provided tips and strategies to maximize position and latch technique.  LATCH Score Latch: Grasps breast easily, tongue down, lips flanged, rhythmical sucking.  Audible Swallowing: Spontaneous and intermittent  Type of Nipple: Everted at rest and after stimulation  Comfort (Breast/Nipple): Soft / non-tender  Hold (Positioning): Assistance needed to correctly position infant at breast and maintain latch.  LATCH Score: 9   Lactation Tools Discussed/Used  Manual pump  Interventions Interventions: Breast feeding basics reviewed;Adjust position;Hand pump;Education;Hand express  Discharge Discharge Education: Engorgement and breast care;Warning signs for feeding baby;Other (comment) (Mom will bring baby for follow-[facility]. Mom is aware she can call [facility] lactation if she has BF questions or would like to be seen outpatient after she goes home.) Pump: Manual (Provided manual Harmony pump. Reviewed use and cleaning of parts, and milk storage for the normal newborn at home.)  Consult Status Consult Status: Complete  Update provided to care nurse.  Michele Love 04/26/2022, 5:30 PM

## 2022-04-26 NOTE — Progress Notes (Signed)
Patient discharged home with infant. Discharge instructions, prescriptions, and follow-up appointment were given to and reviewed with patient by primary RN. Escorted out by [facility].

## 2022-04-26 NOTE — Plan of Care (Signed)

## 2022-04-27 LAB — MEASLES/MUMPS/RUBELLA IMMUNITY
Mumps IgG: 19.4 AU/mL (ref >10.9)
Rubella: 2.8 index (ref >0.99)
Rubeola IgG: <13.5 AU/mL — ABNORMAL LOW (ref >16.4)

## 2022-04-27 LAB — VARICELLA ZOSTER ANTIBODY, IGG: Varicella IgG: 262 index (ref >165)

## 2022-04-28 ENCOUNTER — Ambulatory Visit: Payer: Medicaid Other

## 2022-04-28 DIAGNOSIS — Z3A39 39 weeks gestation of pregnancy: Secondary | ICD-10-CM

## 2022-07-05 ENCOUNTER — Telehealth: Payer: Self-pay | Admitting: Family Medicine

## 2022-12-10 ENCOUNTER — Other Ambulatory Visit: Payer: Self-pay

## 2022-12-10 ENCOUNTER — Emergency Department
Admission: EM | Admit: 2022-12-10 | Discharge: 2022-12-10 | Disposition: A | Discharge status: Home / Self Care | Payer: Medicaid Other | Attending: Emergency Medicine | Admitting: Emergency Medicine

## 2022-12-10 DIAGNOSIS — M5431 Sciatica, right side: Secondary | ICD-10-CM | POA: Diagnosis not present

## 2022-12-10 DIAGNOSIS — M5441 Lumbago with sciatica, right side: Secondary | ICD-10-CM | POA: Diagnosis not present

## 2022-12-10 DIAGNOSIS — M79604 Pain in right leg: Secondary | ICD-10-CM | POA: Diagnosis present

## 2022-12-10 LAB — CK: Total CK: 78 U/L (ref 38–234)

## 2022-12-10 LAB — CBC
HCT: 37.7 % (ref 36.0–46.0)
Hemoglobin: 12.1 g/dL (ref 12.0–15.0)
MCH: 27.8 pg (ref 26.0–34.0)
MCHC: 32.1 g/dL (ref 30.0–36.0)
MCV: 86.7 fL (ref 80.0–100.0)
Platelets: 338 10*3/uL (ref 150–400)
RBC: 4.35 MIL/uL (ref 3.87–5.11)
RDW: 13.5 % (ref 11.5–15.5)
WBC: 6.9 10*3/uL (ref 4.0–10.5)
nRBC: 0 % (ref 0.0–0.2)

## 2022-12-10 LAB — BASIC METABOLIC PANEL
Anion gap: 5 (ref 5–15)
BUN: 15 mg/dL (ref 6–20)
CO2: 27 mmol/L (ref 22–32)
Calcium: 9.4 mg/dL (ref 8.9–10.3)
Chloride: 107 mmol/L (ref 98–111)
Creatinine, Ser: 0.6 mg/dL (ref 0.44–1.00)
GFR, Estimated: >60 mL/min (ref >60)
Glucose, Bld: 81 mg/dL (ref 70–99)
Potassium: 3.6 mmol/L (ref 3.5–5.1)
Sodium: 139 mmol/L (ref 135–145)

## 2022-12-10 MED ORDER — LIDOCAINE 5 % EX PTCH
1.0000 | MEDICATED_PATCH | CUTANEOUS | Status: DC
  Administered 2022-12-10: 1 via TRANSDERMAL
  Filled 2022-12-10: qty 1

## 2022-12-10 MED ORDER — KETOROLAC TROMETHAMINE 30 MG/ML IJ SOLN
30.0000 mg | Freq: Once | INTRAMUSCULAR | Status: AC
  Administered 2022-12-10: 30 mg via INTRAMUSCULAR
  Filled 2022-12-10: qty 1

## 2022-12-10 MED ORDER — LIDOCAINE 5 % EX PTCH: 1.0000 | MEDICATED_PATCH | Freq: Two times a day (BID) | CUTANEOUS | 0 refills | Status: AC

## 2022-12-10 MED ORDER — ACETAMINOPHEN 500 MG PO TABS
1000.0000 mg | ORAL_TABLET | Freq: Once | ORAL | Status: AC
  Administered 2022-12-10: 1000 mg via ORAL
  Filled 2022-12-10: qty 2

## 2022-12-10 MED ORDER — METHOCARBAMOL 500 MG PO TABS
500.0000 mg | ORAL_TABLET | Freq: Once | ORAL | Status: AC
  Administered 2022-12-10: 500 mg via ORAL
  Filled 2022-12-10: qty 1

## 2022-12-10 NOTE — ED Provider Notes (Signed)
[facility] Provider Note    Event Date/Time   First MD Initiated Contact with Patient 12/10/22 [phone]     (approximate)   History   Leg Pain   HPI  Michele Love is a 22 y.o. female who presents to the ED for evaluation of Leg Pain   Patient presents to the ED for evaluation of right-sided leg pain.  She reports unilateral, right-sided, atraumatic leg pain.  Reports pain radiates from her lower back and wraps around her thigh.  Worse with ambulation.  Denies any fevers, bowel or bladder retention or changes, saddle anesthesias.  No falls or trauma.  Symptoms have been persistent for about 1 week, not improved with home vitamins   Physical Exam   Triage Vital Signs: ED Triage Vitals  Enc Vitals Group     BP 12/10/22 0156 109/89     Pulse Rate 12/10/22 0156 65     Resp 12/10/22 0156 17     Temp 12/10/22 0156 97.9 F (36.6 C)     Temp Source 12/10/22 0156 Oral     SpO2 12/10/22 0156 100 %     Weight 12/10/22 0157 117 lb 15.1 oz (53.5 kg)     Height 12/10/22 0157 5' (1.524 m)     Head Circumference --      Peak Flow --      Pain Score 12/10/22 0156 5     Pain Loc --      Pain Edu? --      Excl. in GC? --     Most recent vital signs: Vitals:   12/10/22 0156  BP: 109/89  Pulse: 65  Resp: 17  Temp: 97.9 F (36.6 C)  SpO2: 100%    General: Awake, no distress.  CV:  Good peripheral perfusion.  Resp:  Normal effort.  Abd:  No distention.  MSK:  No deformity noted.  Mild right-sided paraspinal lumbar tenderness without overlying skin changes or signs of trauma Neuro:  No focal deficits appreciated. Cranial nerves II through XII intact 5/5 strength and sensation in all 4 extremities Other:     ED Results / Procedures / Treatments   Labs (all labs ordered are listed, but only abnormal results are displayed) Labs Reviewed  BASIC METABOLIC PANEL  CBC  CK    EKG   RADIOLOGY   Official radiology report(s): No results  found.  PROCEDURES and INTERVENTIONS:  Procedures  Medications  lidocaine (LIDODERM) 5 % 1 patch (1 patch Transdermal Patch Applied 12/10/22 0411)  acetaminophen (TYLENOL) tablet 1,000 mg (1,000 mg Oral Given 12/10/22 0410)  ketorolac (TORADOL) 30 MG/ML injection 30 mg (30 mg Intramuscular Given 12/10/22 0410)  methocarbamol (ROBAXIN) tablet 500 mg (500 mg Oral Given 12/10/22 0411)     IMPRESSION / MDM / ASSESSMENT AND PLAN / ED COURSE  I reviewed the triage vital signs and the nursing notes.  Differential diagnosis includes, but is not limited to, sciatica, rhabdomyolysis, cauda equina  {Patient presents with symptoms of an acute illness or injury that is potentially life-threatening.  Generally healthy 22 year old presents with signs of sciatica on the right side suitable for outpatient management with nonnarcotic multimodal analgesia.  Normal vital signs and reassuring exam without signs of neurologic or vascular deficits.  No signs of red flag features I doubt cauda equina.  No indications for imaging.  Screening blood work from triage with normal CBC and metabolic panel.  I had a CK that is also normal.  Feeling better  with nonnarcotic multimodal analgesia and she is suitable for outpatient management.  We discussed return precautions.  Clinical Course as of 12/10/22 0453  Sun Dec 10, 2022  0451 Reassessed.  Feeling better.  CK is normal.  She is appreciative.  We discussed expectant management and return precautions [DS]    Clinical Course User Index [DS] Delton Prairie, MD     FINAL CLINICAL IMPRESSION(S) / ED DIAGNOSES   Final diagnoses:  Sciatica of right side     Rx / DC Orders   ED Discharge Orders          Ordered    lidocaine (LIDODERM) 5 %  Every 12 hours        12/10/22 0449             Note:  This document was prepared using Dragon voice recognition software and may include unintentional dictation errors.   Delton Prairie, MD 12/10/22 [phone]

## 2022-12-10 NOTE — ED Notes (Signed)
Urine sent to lab 

## 2022-12-10 NOTE — Discharge Instructions (Signed)
Please take Tylenol and ibuprofen/Advil for your pain.  It is safe to take them together, or to alternate them every few hours.  Take up to 1000mg of Tylenol at a time, up to 4 times per day.  Do not take more than 4000 mg of Tylenol in 24 hours.  For ibuprofen, take 400-600 mg, 3 - 4 times per day.  Please use lidocaine patches at your site of pain.  Apply 1 patch at a time, leave on for 12 hours, then remove for 12 hours.  12 hours on, 12 hours off.  Do not apply more than 1 patch at a time.  

## 2022-12-10 NOTE — ED Triage Notes (Signed)
Pt to ED via POV c/o leg cramps. Pt has been having cramping in legs for 1wk. Cramping gets worse when she walks. Pt denies CP, SOB.

## 2022-12-26 DIAGNOSIS — Z113 Encounter for screening for infections with a predominantly sexual mode of transmission: Secondary | ICD-10-CM | POA: Diagnosis not present

## 2023-01-11 DIAGNOSIS — M4727 Other spondylosis with radiculopathy, lumbosacral region: Secondary | ICD-10-CM | POA: Diagnosis not present

## 2023-01-11 DIAGNOSIS — M5431 Sciatica, right side: Secondary | ICD-10-CM | POA: Diagnosis not present

## 2023-01-11 DIAGNOSIS — M47816 Spondylosis without myelopathy or radiculopathy, lumbar region: Secondary | ICD-10-CM | POA: Diagnosis not present

## 2023-01-22 DIAGNOSIS — M5431 Sciatica, right side: Secondary | ICD-10-CM | POA: Diagnosis not present

## 2023-01-24 DIAGNOSIS — M62838 Other muscle spasm: Secondary | ICD-10-CM | POA: Diagnosis not present

## 2023-01-29 ENCOUNTER — Ambulatory Visit: Payer: Medicaid Other | Attending: Internal Medicine

## 2023-01-29 DIAGNOSIS — M5459 Other low back pain: Secondary | ICD-10-CM | POA: Diagnosis not present

## 2023-01-29 DIAGNOSIS — M6281 Muscle weakness (generalized): Secondary | ICD-10-CM | POA: Diagnosis not present

## 2023-01-29 DIAGNOSIS — M5417 Radiculopathy, lumbosacral region: Secondary | ICD-10-CM | POA: Insufficient documentation

## 2023-01-29 DIAGNOSIS — R262 Difficulty in walking, not elsewhere classified: Secondary | ICD-10-CM | POA: Diagnosis not present

## 2023-01-29 NOTE — Therapy (Signed)
[facility] [address], [place], [address] Phone: [phone]   Fax:  [phone]  Physical Therapy Evaluation  Patient Details  Name: Mariha Bettendorf MRN: [id number] Date of Birth: 2000-10-22 Referring Provider (PT): Nadyne Coombes, MD   Encounter Date: 01/29/2023   PT End of Session - 01/29/23 1439     Visit Number 1    Number of Visits 17    Date for PT Re-Evaluation 03/30/23    PT Start Time 1440    PT Stop Time 1520    PT Time Calculation (min) 40 min    Activity Tolerance Patient tolerated treatment well;Patient limited by pain    Behavior During Therapy [facility] for tasks assessed/performed             Past Medical History:  Diagnosis Date   Anemia affecting pregnancy, antepartum 07/14/2020   Dizziness    Headache    Patient denies medical problems     Past Surgical History:  Procedure Laterality Date   denies     NO PAST SURGERIES      There were no vitals filed for this visit.    Subjective Assessment - 01/29/23 1442     Subjective Low back pain: 3/10 currently (pt sitting on chair), 10/10 at worst for the past 3 months. R L5 dermatome to foot: 8/10 currently, 10/10 at worst for the past 3 months.    Pertinent History Low back pain with R LE sciatica. Pain began 2 months ago, sudden onset, unknown mechanism of injury. Had an X-ray for her back which showed arthritis. Has 48 kids (5 years old and 87 months old; no currently pregnant). Had back pain before after her first pregnancy. Has not yet had PT for her current condition. Pain has been on and off since onset. Had to go to the hospital 2 months ago when it first happened. Carried her baby on her R side before pain began. Goes less to the bathroom since 2 months ago, but can make it to the bathroom. Denies saddle anesthesia.    Patient Stated Goals Be better, for the pain to go away.    Currently in Pain? Yes    Pain Score 8     Pain Location  --   R LE, thigh, leg, foot laterally   Pain Orientation Right    Pain Descriptors / Indicators Cramping;Pins and needles;Numbness    Pain Type Acute pain    Pain Radiating Towards R L5 demratome to foot.    Pain Onset More than a month ago    Pain Frequency Constant    Aggravating Factors  R LE: Sitting 20 minutes, standing 10 minutes, bending over, moving too fast (causes a cramp in her hamstrings causing R knee to bend).    Pain Relieving Factors Proping her leg up.                [facility] PT Assessment - 01/29/23 1457       Assessment   Medical Diagnosis M54.31, Sciatica, Right side    Referring Provider (PT) Nadyne Coombes, MD    Onset Date/Surgical Date 12/10/22   Start of pain resulting in ED visit   Prior Therapy No known PT for current condition      Precautions   Precaution Comments No known precautions      Restrictions   Other Position/Activity Restrictions No known restrictions      Posture/Postural Control   Posture Comments B  protracted shoulders, R lumbar side bend, L lateral shift, R trunk rotation. R iliac crest and ASIS lower.      AROM   Lumbar Flexion limited with R thigh pain    Lumbar Extension WFL    Lumbar - Right Side Bend WFL with R LE pain    Lumbar - Left Side Bend limited with R posterior thigh pain    Lumbar - Right Rotation WFL, feels better    Lumbar - Left Rotation [facility]      Strength   Right Hip Flexion 3-/5   with anterior thigh pain   Right Hip Extension 3-/5   seated manually resisted   Right Hip ABduction 3+/5   seated manually resisted   Left Hip Flexion 3/5   with R anterior and lateral thigh pain   Left Hip Extension 3+/5   seated manually resisted   Left Hip ABduction 3+/5   seated manually resisted   Right Knee Flexion 4/5    Right Knee Extension 4+/5    Left Knee Flexion 4-/5    Left Knee Extension 5/5      Ambulation/Gait   Gait Comments Gait: antalgic, decreased stance R LE, with R lateral lean during R LE stance phase.  Decreased gait velocity.                        Objective measurements completed on examination: See above findings.   No latex allergies  No blood pressure problems   Gait: antalgic, decreased stance R LE, with R lateral lean during R LE stance phase. Decreased gait velocity.   sitting with R foot propped on 4 inch step helps decrease R LE and low back pain.   Slight decrease in pain with L lateral shift coorection.    Therapeutic exercise  Seeated R hip extension isometrics with foot on 4 inch step 3x5 seconds  Sitting with decreased R lumbar convexity. Decreased R LE pain.   Sitting on L hip with R foot on 4 inch step to improve posture. Decreased R LE symptoms.   Reviewed and given as part of her HEP. Pt demonstrated and verbalized understanding.   Improved exercise technique, movement at target joints, use of target muscles after mod verbal, visual, tactile cues.    Response to treatment Decreased R LE pain during treatment.     Clinical impression Pt is a 22 year old female who came to physical therapy secondary to low back and R LE pain along the R L5 and sciatic distribution. She also presents with altered gait pattern and posture, decreased trunk and B hip strength, decreased lumbopelvic stability and control, and difficulty performing tasks which involve standing, prolonged sitting, bending over, and moving too fast secondary to pain. Pt will benefit from skilled physical therapy services to address the aforementioned deficits.    Home Exercises  Sitting on L hip with R foot on 4 inch step to improve posture. Decreased R LE symptoms.   Reviewed and given as part of her HEP. Pt demonstrated and verbalized understanding.                 PT Education - 01/29/23 1629     Education Details ther-ex, HEP, POC    Person(s) Educated Patient    Methods Explanation;Demonstration;Tactile cues;Verbal cues    Comprehension Verbalized  understanding;Returned demonstration              PT Short Term Goals - 01/29/23 1620  PT SHORT TERM GOAL #1   Title Pt will be independent with her initial home exercise program to improve posture, trunk and hip strength, decrease pain, and improve ability to perform standing, sitting and walking tasks at home more comfortably.    Baseline Pt has started her initial HEP (01/29/2023)    Time 3    Period Weeks    Status New    Target Date 02/23/23               PT Long Term Goals - 01/29/23 1622       PT LONG TERM GOAL #1   Title Pt will have a decrease in R LE pain to 3/10 or less at worst to promote ability to ambulate as well as tolerate prolonged sitting and improve ability to perform standing tasks more comfortably.    Baseline 10/10 R LE (L5 dermatome and sciatic pain from low back to foot) pain at worst for the past 3 months (01/29/2023)    Time 8    Period Weeks    Status New    Target Date 03/30/23      PT LONG TERM GOAL #2   Title Pt will have a decrease in lwo back pain to 3/10 or less at worst to promote ability to ambulate as well as tolerate prolonged sitting and improve ability to perform standing tasks more comfortably.    Baseline 10/10 low back pain at worst for the past 3 months (01/29/2023)    Time 8    Period Weeks    Status New    Target Date 03/30/23      PT LONG TERM GOAL #3   Title Pt will improve bilateral hip extension and abduction strength by at least 1/2 MMT grade to promote ability to ambulate and perform standing tasks with less low back and R LE pain.    Baseline Hip extension 3-/5 R, 3+/5 L, hip abduction 3+/5 R and L (01/29/2023)    Time 8    Period Weeks    Status New    Target Date 03/30/23      PT LONG TERM GOAL #4   Title Pt will improve her lumbar spine FOTO by at least 10 points as a demonstration of improved function.    Baseline Lumbar Spine FOTO 19 (01/29/2023)    Time 8    Period Weeks    Status New    Target Date  03/30/23                    Plan - 01/29/23 1630     Clinical Impression Statement Pt is a 22 year old female who came to physical therapy secondary to low back and R LE pain along the R L5 and sciatic distribution. She also presents with altered gait pattern and posture, decreased trunk and B hip strength, decreased lumbopelvic stability and control, and difficulty performing tasks which involve standing, prolonged sitting, bending over, and moving too fast secondary to pain. Pt will benefit from skilled physical therapy services to address the aforementioned deficits.    Personal Factors and Comorbidities Fitness;Past/Current Experience    Examination-Activity Limitations Stairs;Bed Mobility;Stand;Bathing;Bend;Sit;Toileting;Caring for Others;Lift;Transfers;Carry;Locomotion Level;Squat    Stability/Clinical Decision Making Stable/Uncomplicated    Clinical Decision Making Low    Rehab Potential Fair    PT Frequency 2x / week    PT Duration 8 weeks    PT Treatment/Interventions Aquatic Therapy;Electrical Stimulation;Cryotherapy;Iontophoresis 4mg /ml Dexamethasone;Gait training;Stair training;Functional mobility training;Therapeutic activities;Therapeutic exercise;Balance training;Neuromuscular re-education;Patient/family  education;Manual techniques;Dry needling;Spinal Manipulations;Joint Manipulations;Traction   manipulations and traction if appropriate.   PT Next Visit Plan Posture, trunk, hip strengthening, lumbopelvic control, mechanics, manual techniques, modalities PRN    Consulted and Agree with Plan of Care Patient             Patient will benefit from skilled therapeutic intervention in order to improve the following deficits and impairments:  Pain, Improper body mechanics, Postural dysfunction, Decreased strength, Difficulty walking, Abnormal gait  Visit Diagnosis: Radiculopathy, lumbosacral region - Plan: PT plan of care cert/re-cert  Other low back pain - Plan: PT plan  of care cert/re-cert  Muscle weakness (generalized) - Plan: PT plan of care cert/re-cert  Difficulty in walking, not elsewhere classified - Plan: PT plan of care cert/re-cert     Problem List Patient Active Problem List   Diagnosis Date Noted   [redacted] weeks gestation of pregnancy 04/26/2022   Postpartum care following vaginal delivery 04/26/2022   Encounter for care or examination of lactating mother 04/26/2022   Labor and delivery, indication for care 04/25/2022   Noncompliance with prenatal care (no care x 6 wks: 22-28 wks) 02/06/2022   Late prenatal care @17  1/7 11/30/2021   Close interconceptual spacing 9 mo 11/30/2021   Biological false-positive (BFP) syphilis serology test on 07/14/20 1:1 but T. Pallidum neg 07/20/2020   Loralyn Freshwater PT, DPT  01/29/2023, 4:45 PM  [facility] [address] [place], [address] Phone: [phone]   Fax:  [phone]  Name: Adelei Maltbie MRN: [id number] Date of Birth: 2001-02-07

## 2023-02-01 ENCOUNTER — Ambulatory Visit: Payer: Medicaid Other

## 2023-02-01 DIAGNOSIS — R262 Difficulty in walking, not elsewhere classified: Secondary | ICD-10-CM | POA: Diagnosis not present

## 2023-02-01 DIAGNOSIS — M5417 Radiculopathy, lumbosacral region: Secondary | ICD-10-CM | POA: Diagnosis not present

## 2023-02-01 DIAGNOSIS — M6281 Muscle weakness (generalized): Secondary | ICD-10-CM

## 2023-02-01 DIAGNOSIS — M5459 Other low back pain: Secondary | ICD-10-CM

## 2023-02-01 NOTE — Therapy (Signed)
OUTPATIENT PHYSICAL THERAPY TREATMENT NOTE   Patient Name: Michele Love MRN: [id number] DOB:Nov 27, 2000, 22 y.o., female Today's Date: 02/01/2023  PCP: Nadyne Coombes, MD  REFERRING PROVIDER: Nadyne Coombes, MD    END OF SESSION:  PT End of Session - 02/01/23 1519     Visit Number 2    Number of Visits 17    Date for PT Re-Evaluation 03/30/23    Authorization Type 02/01/2023-04/01/2023   7 visits    Authorization - Visit Number 1    Authorization - Number of Visits 7    PT Start Time 1520    PT Stop Time 1610    PT Time Calculation (min) 50 min    Activity Tolerance Patient tolerated treatment well;Patient limited by pain    Behavior During Therapy [facility] for tasks assessed/performed             Past Medical History:  Diagnosis Date   Anemia affecting pregnancy, antepartum 07/14/2020   Dizziness    Headache    Patient denies medical problems    Past Surgical History:  Procedure Laterality Date   denies     NO PAST SURGERIES     Patient Active Problem List   Diagnosis Date Noted   [redacted] weeks gestation of pregnancy 04/26/2022   Postpartum care following vaginal delivery 04/26/2022   Encounter for care or examination of lactating mother 04/26/2022   Labor and delivery, indication for care 04/25/2022   Noncompliance with prenatal care (no care x 6 wks: 22-28 wks) 02/06/2022   Late prenatal care @17  1/7 11/30/2021   Close interconceptual spacing 9 mo 11/30/2021   Biological false-positive (BFP) syphilis serology test on 07/14/20 1:1 but T. Pallidum neg 07/20/2020    REFERRING DIAG: M54.31, Sciatica, Right side   THERAPY DIAG:  Radiculopathy, lumbosacral region  Other low back pain  Muscle weakness (generalized)  Difficulty in walking, not elsewhere classified  Rationale for Evaluation and Treatment Rehabilitation  PERTINENT HISTORY: Low back pain with R LE sciatica. Pain began 2 months ago, sudden onset, unknown mechanism of injury. Had an X-ray for her  back which showed arthritis. Has 74 kids (73 years old and 48 months old; no currently pregnant). Had back pain before after her first pregnancy. Has not yet had PT for her current condition. Pain has been on and off since onset. Had to go to the hospital 2 months ago when it first happened. Carried her baby on her R side before pain began. Goes less to the bathroom since 2 months ago, but can make it to the bathroom. Denies saddle anesthesia.   PRECAUTIONS: No known precautions   SUBJECTIVE:   SUBJECTIVE STATEMENT:  a bit better. 1/10 low back, 5/10 R LE pain currently   PAIN:  Are you having pain? See subjective   TODAY'S TREATMENT:  DATE: 02/01/2023  No latex allergies  No blood pressure problems     Gait: antalgic, decreased stance R LE, with R lateral lean during R LE stance phase. Decreased gait velocity.    sitting with R foot propped on 4 inch step helps decrease R LE and low back pain.    Slight decrease in pain with L lateral shift coorection.      Therapeutic exercise   Sitting with slight reclined position  PT assist to decrease R lumbar convexity. Slight decrease in symptoms.    Transversus abdominis contraction 10x5 seconds for 3 sets   Decreased R LE symptoms during contraction   Glute set 10x5 seconds for 3 sets   Decreased R LE symptoms during contraction  Sitting with upright posture, PT assist to decrease R lumbar convexity and decrease R lumbar rotation posture   Manually resisted R upper trunk rotation isometrics in neutral to decrease R lumbar rotation posture (to promote L lower lumbar rotation) 10x5 seconds, then 10x10 seconds for 2 sets   Manually resisted L lateral shift isometrics in neutral 10x5 seconds, then 10x10 seconds  Seated L trunk rotation 10x5 seconds  Seated press-up position. Increased symptoms.      Improved exercise technique, movement at target joints, use of target muscles after mod verbal, visual, tactile cues.      Response to treatment Decreased R LE pain to 3/10 after session, 0/10 low back pain afterwards        Clinical impression Worked on decreasing R lumbar convexity, and R lumbar rotation to promote more neutral posture and decrease stress to low back and R LE nerves. Worked on activating trunk and glute muscles to help maintain a more neutral posture. Decreased low back pain to 0/10 and R LE pain to 3/10 after session. Pt will benefit from continued skilled physical therapy services to decrease pain, improve strength and function.        PATIENT EDUCATION: Education details: there-ex, HEP Person educated: Patient Education method: Explanation, Demonstration, Tactile cues, Verbal cues, and Handouts Education comprehension: verbalized understanding and returned demonstration  HOME EXERCISE PROGRAM: Sitting on L hip with R foot on 4 inch step to improve posture. Decreased R LE symptoms.              Reviewed and given as part of her HEP. Pt demonstrated and verbalized understanding.    Access Code: [email or web address] URL: [email or web address] Date: 02/01/2023 Prepared by: Loralyn Freshwater  Exercises - Seated Transversus Abdominis Bracing  - 5 x daily - 7 x weekly - 3 sets - 10 reps - 5 seconds hold - Seated Gluteal Sets  - 5 x daily - 7 x weekly - 3 sets - 10 reps - 5 seconds hold        PT Short Term Goals - 01/29/23 1620       PT SHORT TERM GOAL #1   Title Pt will be independent with her initial home exercise program to improve posture, trunk and hip strength, decrease pain, and improve ability to perform standing, sitting and walking tasks at home more comfortably.    Baseline Pt has started her initial HEP (01/29/2023)    Time 3    Period Weeks    Status New    Target Date 02/23/23              PT Long Term Goals - 01/29/23 1622        PT LONG TERM GOAL #1   Title Pt will have  a decrease in R LE pain to 3/10 or less at worst to promote ability to ambulate as well as tolerate prolonged sitting and improve ability to perform standing tasks more comfortably.    Baseline 10/10 R LE (L5 dermatome and sciatic pain from low back to foot) pain at worst for the past 3 months (01/29/2023)    Time 8    Period Weeks    Status New    Target Date 03/30/23      PT LONG TERM GOAL #2   Title Pt will have a decrease in lwo back pain to 3/10 or less at worst to promote ability to ambulate as well as tolerate prolonged sitting and improve ability to perform standing tasks more comfortably.    Baseline 10/10 low back pain at worst for the past 3 months (01/29/2023)    Time 8    Period Weeks    Status New    Target Date 03/30/23      PT LONG TERM GOAL #3   Title Pt will improve bilateral hip extension and abduction strength by at least 1/2 MMT grade to promote ability to ambulate and perform standing tasks with less low back and R LE pain.    Baseline Hip extension 3-/5 R, 3+/5 L, hip abduction 3+/5 R and L (01/29/2023)    Time 8    Period Weeks    Status New    Target Date 03/30/23      PT LONG TERM GOAL #4   Title Pt will improve her lumbar spine FOTO by at least 10 points as a demonstration of improved function.    Baseline Lumbar Spine FOTO 19 (01/29/2023)    Time 8    Period Weeks    Status New    Target Date 03/30/23              Plan - 02/01/23 1517     Clinical Impression Statement Worked on decreasing R lumbar convexity, and R lumbar rotation to promote more neutral posture and decrease stress to low back and R LE nerves. Worked on activating trunk and glute muscles to help maintain a more neutral posture. Decreased low back pain to 0/10 and R LE pain to 3/10 after session. Pt will benefit from continued skilled physical therapy services to decrease pain, improve strength and function.    Personal Factors and Comorbidities  Fitness;Past/Current Experience    Examination-Activity Limitations Stairs;Bed Mobility;Stand;Bathing;Bend;Sit;Toileting;Caring for Others;Lift;Transfers;Carry;Locomotion Level;Squat    Stability/Clinical Decision Making Stable/Uncomplicated    Rehab Potential Fair    PT Frequency 2x / week    PT Duration 8 weeks    PT Treatment/Interventions Aquatic Therapy;Electrical Stimulation;Cryotherapy;Iontophoresis 4mg /ml Dexamethasone;Gait training;Stair training;Functional mobility training;Therapeutic activities;Therapeutic exercise;Balance training;Neuromuscular re-education;Patient/family education;Manual techniques;Dry needling;Spinal Manipulations;Joint Manipulations;Traction   manipulations and traction if appropriate.   PT Next Visit Plan Posture, trunk, hip strengthening, lumbopelvic control, mechanics, manual techniques, modalities PRN    PT Home Exercise Plan medbridge Access Code: [email or web address]    Consulted and Agree with Plan of Care Patient              Loralyn Freshwater PT, DPT  02/01/2023, 4:12 PM

## 2023-02-02 ENCOUNTER — Ambulatory Visit: Payer: Medicaid Other

## 2023-02-09 ENCOUNTER — Ambulatory Visit: Payer: Medicaid Other

## 2023-02-09 DIAGNOSIS — M6281 Muscle weakness (generalized): Secondary | ICD-10-CM

## 2023-02-09 DIAGNOSIS — R262 Difficulty in walking, not elsewhere classified: Secondary | ICD-10-CM

## 2023-02-09 DIAGNOSIS — M5459 Other low back pain: Secondary | ICD-10-CM

## 2023-02-09 DIAGNOSIS — M5417 Radiculopathy, lumbosacral region: Secondary | ICD-10-CM | POA: Diagnosis not present

## 2023-02-09 NOTE — Therapy (Signed)
OUTPATIENT PHYSICAL THERAPY TREATMENT NOTE   Patient Name: Michele Love MRN: [id number] DOB:April 03, 2001, 22 y.o., female Today's Date: 02/09/2023  PCP: Nadyne Coombes, MD  REFERRING PROVIDER: Nadyne Coombes, MD    END OF SESSION:  PT End of Session - 02/09/23 0942     Visit Number 3    Number of Visits 17    Date for PT Re-Evaluation 03/30/23    Authorization Type 02/01/2023-04/01/2023   7 visits    Authorization - Number of Visits 7    PT Start Time 0942    PT Stop Time 1025    PT Time Calculation (min) 43 min    Activity Tolerance Patient tolerated treatment well;Patient limited by pain    Behavior During Therapy [facility] for tasks assessed/performed              Past Medical History:  Diagnosis Date   Anemia affecting pregnancy, antepartum 07/14/2020   Dizziness    Headache    Patient denies medical problems    Past Surgical History:  Procedure Laterality Date   denies     NO PAST SURGERIES     Patient Active Problem List   Diagnosis Date Noted   [redacted] weeks gestation of pregnancy 04/26/2022   Postpartum care following vaginal delivery 04/26/2022   Encounter for care or examination of lactating mother 04/26/2022   Labor and delivery, indication for care 04/25/2022   Noncompliance with prenatal care (no care x 6 wks: 22-28 wks) 02/06/2022   Late prenatal care @17  1/7 11/30/2021   Close interconceptual spacing 9 mo 11/30/2021   Biological false-positive (BFP) syphilis serology test on 07/14/20 1:1 but T. Pallidum neg 07/20/2020    REFERRING DIAG: M54.31, Sciatica, Right side   THERAPY DIAG:  Radiculopathy, lumbosacral region  Other low back pain  Muscle weakness (generalized)  Difficulty in walking, not elsewhere classified  Rationale for Evaluation and Treatment Rehabilitation  PERTINENT HISTORY: Low back pain with R LE sciatica. Pain began 2 months ago, sudden onset, unknown mechanism of injury. Had an X-ray for her back which showed arthritis. Has 78  kids (65 years old and 47 months old; no currently pregnant). Had back pain before after her first pregnancy. Has not yet had PT for her current condition. Pain has been on and off since onset. Had to go to the hospital 2 months ago when it first happened. Carried her baby on her R side before pain began. Goes less to the bathroom since 2 months ago, but can make it to the bathroom. Denies saddle anesthesia.   PRECAUTIONS: No known precautions   SUBJECTIVE:   SUBJECTIVE STATEMENT:  R low back and LE are feeling good right now. 2/10 R posterior thigh pain currently. No low back pain currently. Pt states that the exercises helped a lot.   PAIN:  Are you having pain? See subjective   TODAY'S TREATMENT:  DATE: 02/09/2023  No latex allergies  No blood pressure problems     Gait: antalgic, decreased stance R LE, with R lateral lean during R LE stance phase. Decreased gait velocity.    sitting with R foot propped on 4 inch step helps decrease R LE and low back pain.    Slight decrease in pain with L lateral shift correction.      Therapeutic exercise    Sitting (with R foot on 4 inch step) with upright posture, PT assist to decrease R lumbar convexity and decrease R lumbar rotation posture   Manually resisted R upper trunk rotation isometrics in neutral to decrease R lumbar rotation posture (to promote L lower lumbar rotation) 10x5 seconds for 3 sets   Manually resisted L lateral shift isometrics with manually resisted R upper trunk rotation isometrics in neutral 10x5 seconds, then 10x10 seconds  Improved low back posture observed.    R hip extension isometrics 10x5 seconds    L trunk rotation 10x5 seconds for 2 set   Manual and verbal cues to decrease L lumbar convexity   No R LE symptoms after aforementioned exercises    B scapular retraction 10x10  seconds    Standing B shoulder extension (low rows) yellow band 10x         Improved exercise technique, movement at target joints, use of target muscles after mod verbal, visual, tactile cues.      Response to treatment  No low back abd R LE pain reported after session.        Clinical impression  Improving low back and R LE pain based on subjective reports. Continued working on decreasing R lumbar convexity, and R lumbar rotation to promote more neutral posture and decrease stress to low back and R LE nerves. Continued working on activating trunk and glute muscles to help maintain a more neutral posture. Decreased low back pain to 0/10 and R LE pain to 0/10 after session. Pt will benefit from continued skilled physical therapy services to decrease pain, improve strength and function.        PATIENT EDUCATION: Education details: there-ex, HEP Person educated: Patient Education method: Explanation, Demonstration, Tactile cues, Verbal cues, and Handouts Education comprehension: verbalized understanding and returned demonstration  HOME EXERCISE PROGRAM: Sitting on L hip with R foot on 4 inch step to improve posture. Decreased R LE symptoms.              Reviewed and given as part of her HEP. Pt demonstrated and verbalized understanding.    Access Code: [email or web address] URL: [email or web address] Date: 02/01/2023 Prepared by: Loralyn Freshwater  Exercises - Seated Transversus Abdominis Bracing  - 5 x daily - 7 x weekly - 3 sets - 10 reps - 5 seconds hold - Seated Gluteal Sets  - 5 x daily - 7 x weekly - 3 sets - 10 reps - 5 seconds hold        PT Short Term Goals - 01/29/23 1620       PT SHORT TERM GOAL #1   Title Pt will be independent with her initial home exercise program to improve posture, trunk and hip strength, decrease pain, and improve ability to perform standing, sitting and walking tasks at home more comfortably.    Baseline Pt has started her initial  HEP (01/29/2023)    Time 3    Period Weeks    Status New    Target Date 02/23/23  PT Long Term Goals - 01/29/23 1622       PT LONG TERM GOAL #1   Title Pt will have a decrease in R LE pain to 3/10 or less at worst to promote ability to ambulate as well as tolerate prolonged sitting and improve ability to perform standing tasks more comfortably.    Baseline 10/10 R LE (L5 dermatome and sciatic pain from low back to foot) pain at worst for the past 3 months (01/29/2023)    Time 8    Period Weeks    Status New    Target Date 03/30/23      PT LONG TERM GOAL #2   Title Pt will have a decrease in lwo back pain to 3/10 or less at worst to promote ability to ambulate as well as tolerate prolonged sitting and improve ability to perform standing tasks more comfortably.    Baseline 10/10 low back pain at worst for the past 3 months (01/29/2023)    Time 8    Period Weeks    Status New    Target Date 03/30/23      PT LONG TERM GOAL #3   Title Pt will improve bilateral hip extension and abduction strength by at least 1/2 MMT grade to promote ability to ambulate and perform standing tasks with less low back and R LE pain.    Baseline Hip extension 3-/5 R, 3+/5 L, hip abduction 3+/5 R and L (01/29/2023)    Time 8    Period Weeks    Status New    Target Date 03/30/23      PT LONG TERM GOAL #4   Title Pt will improve her lumbar spine FOTO by at least 10 points as a demonstration of improved function.    Baseline Lumbar Spine FOTO 19 (01/29/2023)    Time 8    Period Weeks    Status New    Target Date 03/30/23              Plan - 02/09/23 0937     Clinical Impression Statement Improving low back and R LE pain based on subjective reports. Continued working on decreasing R lumbar convexity, and R lumbar rotation to promote more neutral posture and decrease stress to low back and R LE nerves. Continued working on activating trunk and glute muscles to help maintain a more  neutral posture. Decreased low back pain to 0/10 and R LE pain to 0/10 after session. Pt will benefit from continued skilled physical therapy services to decrease pain, improve strength and function.    Personal Factors and Comorbidities Fitness;Past/Current Experience    Examination-Activity Limitations Stairs;Bed Mobility;Stand;Bathing;Bend;Sit;Toileting;Caring for Others;Lift;Transfers;Carry;Locomotion Level;Squat    Stability/Clinical Decision Making Stable/Uncomplicated    Clinical Decision Making Low    Rehab Potential Fair    PT Frequency 2x / week    PT Duration 8 weeks    PT Treatment/Interventions Aquatic Therapy;Electrical Stimulation;Cryotherapy;Iontophoresis 4mg /ml Dexamethasone;Gait training;Stair training;Functional mobility training;Therapeutic activities;Therapeutic exercise;Balance training;Neuromuscular re-education;Patient/family education;Manual techniques;Dry needling;Spinal Manipulations;Joint Manipulations;Traction   manipulations and traction if appropriate.   PT Next Visit Plan Posture, trunk, hip strengthening, lumbopelvic control, mechanics, manual techniques, modalities PRN    PT Home Exercise Plan medbridge Access Code: [email or web address]    Consulted and Agree with Plan of Care Patient              Loralyn Freshwater PT, DPT  02/09/2023, 11:24 AM

## 2023-02-13 ENCOUNTER — Ambulatory Visit: Payer: Medicaid Other | Attending: Internal Medicine

## 2023-02-13 DIAGNOSIS — R262 Difficulty in walking, not elsewhere classified: Secondary | ICD-10-CM | POA: Diagnosis not present

## 2023-02-13 DIAGNOSIS — M6281 Muscle weakness (generalized): Secondary | ICD-10-CM | POA: Diagnosis not present

## 2023-02-13 DIAGNOSIS — M5459 Other low back pain: Secondary | ICD-10-CM

## 2023-02-13 DIAGNOSIS — M5417 Radiculopathy, lumbosacral region: Secondary | ICD-10-CM

## 2023-02-13 NOTE — Therapy (Signed)
OUTPATIENT PHYSICAL THERAPY TREATMENT NOTE   Patient Name: Michele Love MRN: [id number] DOB:06-07-2001, 22 y.o., female Today's Date: 02/13/2023  PCP: Nadyne Coombes, MD  REFERRING PROVIDER: Nadyne Coombes, MD    END OF SESSION:  PT End of Session - 02/13/23 1307     Visit Number 4    Number of Visits 17    Date for PT Re-Evaluation 03/30/23    Authorization Type 02/01/2023-04/01/2023   7 visits    Authorization - Number of Visits 7    PT Start Time 1307    PT Stop Time 1345    PT Time Calculation (min) 38 min    Activity Tolerance Patient tolerated treatment well;Patient limited by pain    Behavior During Therapy [facility] for tasks assessed/performed               Past Medical History:  Diagnosis Date   Anemia affecting pregnancy, antepartum 07/14/2020   Dizziness    Headache    Patient denies medical problems    Past Surgical History:  Procedure Laterality Date   denies     NO PAST SURGERIES     Patient Active Problem List   Diagnosis Date Noted   [redacted] weeks gestation of pregnancy 04/26/2022   Postpartum care following vaginal delivery 04/26/2022   Encounter for care or examination of lactating mother 04/26/2022   Labor and delivery, indication for care 04/25/2022   Noncompliance with prenatal care (no care x 6 wks: 22-28 wks) 02/06/2022   Late prenatal care @17  1/7 11/30/2021   Close interconceptual spacing 9 mo 11/30/2021   Biological false-positive (BFP) syphilis serology test on 07/14/20 1:1 but T. Pallidum neg 07/20/2020    REFERRING DIAG: M54.31, Sciatica, Right side   THERAPY DIAG:  Radiculopathy, lumbosacral region  Other low back pain  Muscle weakness (generalized)  Difficulty in walking, not elsewhere classified  Rationale for Evaluation and Treatment Rehabilitation  PERTINENT HISTORY: Low back pain with R LE sciatica. Pain began 2 months ago, sudden onset, unknown mechanism of injury. Had an X-ray for her back which showed arthritis. Has 71  kids (26 years old and 15 months old; no currently pregnant). Had back pain before after her first pregnancy. Has not yet had PT for her current condition. Pain has been on and off since onset. Had to go to the hospital 2 months ago when it first happened. Carried her baby on her R side before pain began. Goes less to the bathroom since 2 months ago, but can make it to the bathroom. Denies saddle anesthesia.   PRECAUTIONS: No known precautions   SUBJECTIVE:   SUBJECTIVE STATEMENT:  R low back and LE are good. R LE is a 2/10 currently (sciatic nerve to knee)  PAIN:  Are you having pain? See subjective   TODAY'S TREATMENT:  DATE: 02/13/2023  No latex allergies  No blood pressure problems     Gait: antalgic, decreased stance R LE, with R lateral lean during R LE stance phase. Decreased gait velocity.    sitting with R foot propped on 4 inch step helps decrease R LE and low back pain.    Slight decrease in pain with L lateral shift correction.      Therapeutic exercise   Seated R piriformis stretch 30 seconds x 5  R posterior thigh pain feels better afterwards per pt.   Seated hip IR AROM   R 10x3  Hip adduction isometric small green ball squeeze in sitting 10x3 with 5 second holds.   No low back and R thigh pain after aforementioned exercises.   Split squat with contralateral UE assist   R 10x3  Standing with B UE assist   Hip abduction with yellow band around ankles   R 10x3   L 10x3  Standing B shoulder low rows (hands to under navel) yellow band with B scapular retraction 10x5 seconds for 2 sets  To promote trunk strength. Good abdominal muscle activation reported.     Improved exercise technique, movement at target joints, use of target muscles after mod verbal, visual, tactile cues.      Response to treatment  No low back abd R LE  pain reported after session.        Clinical impression  Pt continues to demonstrate overall improved low back and R LE pain based on subjective report. Worked on decreasing R piriformis muscle tension to R sciatic nerve as well as improving trunk and glute strength to decrease stress to low back and R hip while performing standing tasks as well as during gait. No pain reported after session.  Pt will benefit from continued skilled physical therapy services to decrease pain, improve strength and function.        PATIENT EDUCATION: Education details: there-ex, HEP Person educated: Patient Education method: Explanation, Demonstration, Tactile cues, Verbal cues, and Handouts Education comprehension: verbalized understanding and returned demonstration  HOME EXERCISE PROGRAM: Sitting on L hip with R foot on 4 inch step to improve posture. Decreased R LE symptoms.              Reviewed and given as part of her HEP. Pt demonstrated and verbalized understanding.    Access Code: [email or web address] URL: [email or web address] Date: 02/01/2023 Prepared by: Loralyn Freshwater  Exercises - Seated Transversus Abdominis Bracing  - 5 x daily - 7 x weekly - 3 sets - 10 reps - 5 seconds hold - Seated Gluteal Sets  - 5 x daily - 7 x weekly - 3 sets - 10 reps - 5 seconds hold  - Seated Piriformis Stretch  - 3 x daily - 7 x weekly - 1 sets - 5 reps - 30 seconds hold  - Seated Hip Internal Rotation AROM  - 1 x daily - 7 x weekly - 3 sets - 10 reps    PT Short Term Goals - 01/29/23 1620       PT SHORT TERM GOAL #1   Title Pt will be independent with her initial home exercise program to improve posture, trunk and hip strength, decrease pain, and improve ability to perform standing, sitting and walking tasks at home more comfortably.    Baseline Pt has started her initial HEP (01/29/2023)    Time 3    Period Weeks    Status New    Target Date 02/23/23  PT Long Term Goals -  01/29/23 1622       PT LONG TERM GOAL #1   Title Pt will have a decrease in R LE pain to 3/10 or less at worst to promote ability to ambulate as well as tolerate prolonged sitting and improve ability to perform standing tasks more comfortably.    Baseline 10/10 R LE (L5 dermatome and sciatic pain from low back to foot) pain at worst for the past 3 months (01/29/2023)    Time 8    Period Weeks    Status New    Target Date 03/30/23      PT LONG TERM GOAL #2   Title Pt will have a decrease in lwo back pain to 3/10 or less at worst to promote ability to ambulate as well as tolerate prolonged sitting and improve ability to perform standing tasks more comfortably.    Baseline 10/10 low back pain at worst for the past 3 months (01/29/2023)    Time 8    Period Weeks    Status New    Target Date 03/30/23      PT LONG TERM GOAL #3   Title Pt will improve bilateral hip extension and abduction strength by at least 1/2 MMT grade to promote ability to ambulate and perform standing tasks with less low back and R LE pain.    Baseline Hip extension 3-/5 R, 3+/5 L, hip abduction 3+/5 R and L (01/29/2023)    Time 8    Period Weeks    Status New    Target Date 03/30/23      PT LONG TERM GOAL #4   Title Pt will improve her lumbar spine FOTO by at least 10 points as a demonstration of improved function.    Baseline Lumbar Spine FOTO 19 (01/29/2023)    Time 8    Period Weeks    Status New    Target Date 03/30/23              Plan - 02/13/23 1307     Clinical Impression Statement Pt continues to demonstrate overall improved low back and R LE pain based on subjective report. Worked on decreasing R piriformis muscle tension to R sciatic nerve as well as improving trunk and glute strength to decrease stress to low back and R hip while performing standing tasks as well as during gait. No pain reported after session.  Pt will benefit from continued skilled physical therapy services to decrease pain,  improve strength and function.    Personal Factors and Comorbidities Fitness;Past/Current Experience    Examination-Activity Limitations Stairs;Bed Mobility;Stand;Bathing;Bend;Sit;Toileting;Caring for Others;Lift;Transfers;Carry;Locomotion Level;Squat    Stability/Clinical Decision Making Stable/Uncomplicated    Rehab Potential Fair    PT Frequency 2x / week    PT Duration 8 weeks    PT Treatment/Interventions Aquatic Therapy;Electrical Stimulation;Cryotherapy;Iontophoresis 4mg /ml Dexamethasone;Gait training;Stair training;Functional mobility training;Therapeutic activities;Therapeutic exercise;Balance training;Neuromuscular re-education;Patient/family education;Manual techniques;Dry needling;Spinal Manipulations;Joint Manipulations;Traction   manipulations and traction if appropriate.   PT Next Visit Plan Posture, trunk, hip strengthening, lumbopelvic control, mechanics, manual techniques, modalities PRN    PT Home Exercise Plan medbridge Access Code: [email or web address]    Consulted and Agree with Plan of Care Patient              Loralyn Freshwater PT, DPT  02/13/2023, 2:46 PM

## 2023-02-16 ENCOUNTER — Ambulatory Visit: Payer: Medicaid Other

## 2023-02-16 DIAGNOSIS — M5459 Other low back pain: Secondary | ICD-10-CM | POA: Diagnosis not present

## 2023-02-16 DIAGNOSIS — M5417 Radiculopathy, lumbosacral region: Secondary | ICD-10-CM | POA: Diagnosis not present

## 2023-02-16 DIAGNOSIS — R262 Difficulty in walking, not elsewhere classified: Secondary | ICD-10-CM | POA: Diagnosis not present

## 2023-02-16 DIAGNOSIS — M6281 Muscle weakness (generalized): Secondary | ICD-10-CM | POA: Diagnosis not present

## 2023-02-16 NOTE — Therapy (Signed)
OUTPATIENT PHYSICAL THERAPY TREATMENT   Patient Name: Michele Love MRN: [id number] DOB:2000-08-29, 22 y.o., female Today's Date: 02/16/2023  PCP: Nadyne Coombes, MD  REFERRING PROVIDER: Nadyne Coombes, MD    END OF SESSION:  PT End of Session - 02/16/23 1038     Visit Number 5    Number of Visits 17    Date for PT Re-Evaluation 03/30/23    Authorization Type 02/01/2023-04/01/2023   7 visits    Progress Note Due on Visit 10    PT Start Time 1035    PT Stop Time 1115    PT Time Calculation (min) 40 min    Activity Tolerance Patient tolerated treatment well;Patient limited by pain    Behavior During Therapy [facility] for tasks assessed/performed               Past Medical History:  Diagnosis Date   Anemia affecting pregnancy, antepartum 07/14/2020   Dizziness    Headache    Patient denies medical problems    Past Surgical History:  Procedure Laterality Date   denies     NO PAST SURGERIES     Patient Active Problem List   Diagnosis Date Noted   [redacted] weeks gestation of pregnancy 04/26/2022   Postpartum care following vaginal delivery 04/26/2022   Encounter for care or examination of lactating mother 04/26/2022   Labor and delivery, indication for care 04/25/2022   Noncompliance with prenatal care (no care x 6 wks: 22-28 wks) 02/06/2022   Late prenatal care @17  1/7 11/30/2021   Close interconceptual spacing 9 mo 11/30/2021   Biological false-positive (BFP) syphilis serology test on 07/14/20 1:1 but T. Pallidum neg 07/20/2020    REFERRING DIAG: M54.31, Sciatica, Right side   THERAPY DIAG:  Radiculopathy, lumbosacral region  Other low back pain  Muscle weakness (generalized)  Difficulty in walking, not elsewhere classified  Rationale for Evaluation and Treatment Rehabilitation  PERTINENT HISTORY: Low back pain with R LE sciatica. Pain began 2 months ago, sudden onset, unknown mechanism of injury. Had an X-ray for her back which showed arthritis. Has 89 kids (79  years old and 56 months old; no currently pregnant). Had back pain before after her first pregnancy. Has not yet had PT for her current condition. Pain has been on and off since onset. Had to go to the hospital 2 months ago when it first happened. Carried her baby on her R side before pain began. Goes less to the bathroom since 2 months ago, but can make it to the bathroom. Denies saddle anesthesia.   PRECAUTIONS: No known precautions   SUBJECTIVE:   SUBJECTIVE STATEMENT:   Pt says she was a little sore after last session, lasted until today. Radicular leg symptoms in mid thigh are 2-3/10 on arrival. HEP gong well. Still having cramping issues in her Right leg when lying down.   PAIN:  Are you having pain? *See above   Treatment today:   Seated R piriformis stretch 30 seconds x 5                R posterior thigh pain feels better afterwards per pt.  Seated hip IR AROM                 R 10x4 Hip adduction isometric small green ball squeeze in sitting 10x3 with 5 second holds    Pain down to 1/10 at this point  Split squat with contralateral UE assist  R 10x3 Standing with B UE assist                 Hip abduction with yellow band around ankles                         R 10x3                         L 10x3   Standing B shoulder low rows (hands to pockets) yellow band with B scapular retraction 10x5 seconds for 3 sets *cues for scapular involvement and retraction  No pain in Right thigh at end of session.   Pt asking about something she can do to address intermittent soreness in leg, Thereasa Parkin will defer any HEP additions to primary therapist at this time.  Pt aslo asking about help with ongoing cramping in leg in lying down- recommended trial of pillow under knees when in supine and also between knees when in sidelying.     PATIENT EDUCATION: Education details: there-ex, HEP Person educated: Patient Education method: Explanation, Demonstration, Tactile cues, Verbal  cues, and Handouts Education comprehension: verbalized understanding and returned demonstration  HOME EXERCISE PROGRAM: Sitting on L hip with R foot on 4 inch step to improve posture. Decreased R LE symptoms.              Reviewed and given as part of her HEP. Pt demonstrated and verbalized understanding.    Access Code: [email or web address] URL: [email or web address] Date: 02/01/2023 Prepared by: Loralyn Freshwater  Exercises - Seated Transversus Abdominis Bracing  - 5 x daily - 7 x weekly - 3 sets - 10 reps - 5 seconds hold - Seated Gluteal Sets  - 5 x daily - 7 x weekly - 3 sets - 10 reps - 5 seconds hold - Seated Piriformis Stretch  - 3 x daily - 7 x weekly - 1 sets - 5 reps - 30 seconds hold - Seated Hip Internal Rotation AROM  - 1 x daily - 7 x weekly - 3 sets - 10 reps    PT Short Term Goals - 01/29/23 1620       PT SHORT TERM GOAL #1   Title Pt will be independent with her initial home exercise program to improve posture, trunk and hip strength, decrease pain, and improve ability to perform standing, sitting and walking tasks at home more comfortably.    Baseline Pt has started her initial HEP (01/29/2023)    Time 3    Period Weeks    Status New    Target Date 02/23/23              PT Long Term Goals - 01/29/23 1622       PT LONG TERM GOAL #1   Title Pt will have a decrease in R LE pain to 3/10 or less at worst to promote ability to ambulate as well as tolerate prolonged sitting and improve ability to perform standing tasks more comfortably.    Baseline 10/10 R LE (L5 dermatome and sciatic pain from low back to foot) pain at worst for the past 3 months (01/29/2023)    Time 8    Period Weeks    Status New    Target Date 03/30/23      PT LONG TERM GOAL #2   Title Pt will have a decrease in lwo back pain to 3/10 or less at worst to promote ability to ambulate  as well as tolerate prolonged sitting and improve ability to perform standing tasks more comfortably.     Baseline 10/10 low back pain at worst for the past 3 months (01/29/2023)    Time 8    Period Weeks    Status New    Target Date 03/30/23      PT LONG TERM GOAL #3   Title Pt will improve bilateral hip extension and abduction strength by at least 1/2 MMT grade to promote ability to ambulate and perform standing tasks with less low back and R LE pain.    Baseline Hip extension 3-/5 R, 3+/5 L, hip abduction 3+/5 R and L (01/29/2023)    Time 8    Period Weeks    Status New    Target Date 03/30/23      PT LONG TERM GOAL #4   Title Pt will improve her lumbar spine FOTO by at least 10 points as a demonstration of improved function.    Baseline Lumbar Spine FOTO 19 (01/29/2023)    Time 8    Period Weeks    Status New    Target Date 03/30/23              Plan - 02/16/23 1039     Clinical Impression Statement Continued with POC as previously established. Tolerance remains as desired by clinician. Pt continues to experience reduced radicular symptoms within session. Pt requires some intermittent cues for form correction.  Will continue to benefit from skilled PT intervention to restore to PLOF.   Personal Factors and Comorbidities Fitness;Past/Current Experience    Examination-Activity Limitations Stairs;Bed Mobility;Stand;Bathing;Bend;Sit;Toileting;Caring for Others;Lift;Transfers;Carry;Locomotion Level;Squat    Stability/Clinical Decision Making Stable/Uncomplicated    Clinical Decision Making Low    Rehab Potential Fair    PT Frequency 2x / week    PT Duration 8 weeks    PT Treatment/Interventions Aquatic Therapy;Electrical Stimulation;Cryotherapy;Iontophoresis 4mg /ml Dexamethasone;Gait training;Stair training;Functional mobility training;Therapeutic activities;Therapeutic exercise;Balance training;Neuromuscular re-education;Patient/family education;Manual techniques;Dry needling;Spinal Manipulations;Joint Manipulations;Traction    PT Next Visit Plan Posture, trunk, hip strengthening,  lumbopelvic control, mechanics, manual techniques, modalities PRN    PT Home Exercise Plan medbridge Access Code: [email or web address]    Consulted and Agree with Plan of Care Patient             10:41 AM, 02/16/23 Rosamaria Lints, PT, DPT Physical Therapist - [place] [phone] (Office)   02/16/2023, 10:41 AM

## 2023-02-20 ENCOUNTER — Ambulatory Visit: Payer: Medicaid Other

## 2023-02-20 ENCOUNTER — Telehealth: Payer: Self-pay

## 2023-02-20 DIAGNOSIS — M5442 Lumbago with sciatica, left side: Secondary | ICD-10-CM | POA: Diagnosis not present

## 2023-02-20 DIAGNOSIS — M5441 Lumbago with sciatica, right side: Secondary | ICD-10-CM | POA: Diagnosis not present

## 2023-02-20 DIAGNOSIS — Z7952 Long term (current) use of systemic steroids: Secondary | ICD-10-CM | POA: Diagnosis not present

## 2023-02-20 NOTE — Telephone Encounter (Signed)
No show. Called patient and left a message pertaining to appointment and a reminder for the next follow up session. Return phone call requested. Phone number ([phone]) provided.   

## 2023-02-22 ENCOUNTER — Ambulatory Visit: Payer: Medicaid Other

## 2023-02-22 ENCOUNTER — Telehealth: Payer: Self-pay

## 2023-02-22 NOTE — Telephone Encounter (Signed)
No show. Called patient and left a message pertaining to appointment and a reminder for the next follow up session. Return phone call requested. Phone number ([phone]) provided.   

## 2023-02-27 ENCOUNTER — Ambulatory Visit: Payer: Medicaid Other

## 2023-03-14 DIAGNOSIS — H5213 Myopia, bilateral: Secondary | ICD-10-CM | POA: Diagnosis not present

## 2023-03-27 DIAGNOSIS — M5442 Lumbago with sciatica, left side: Secondary | ICD-10-CM | POA: Diagnosis not present

## 2023-03-27 DIAGNOSIS — M5416 Radiculopathy, lumbar region: Secondary | ICD-10-CM | POA: Diagnosis not present

## 2023-03-27 DIAGNOSIS — M5441 Lumbago with sciatica, right side: Secondary | ICD-10-CM | POA: Diagnosis not present

## 2023-06-21 IMAGING — US US OB < 14 WKS - US OB TV - US DOPPLER
1 series · 13 of 28 positions shown · non-contrast
Comparison: None.

CLINICAL DATA: Abdominal pain



[Series 1: us ob less than 14 weeks with ob transvaginal · 13 of 107 slices shown]
[im 4/107]
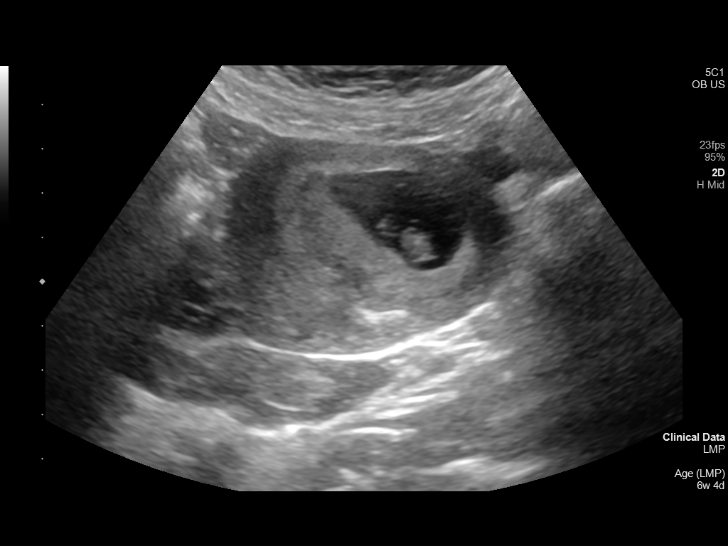
[im 12/107]
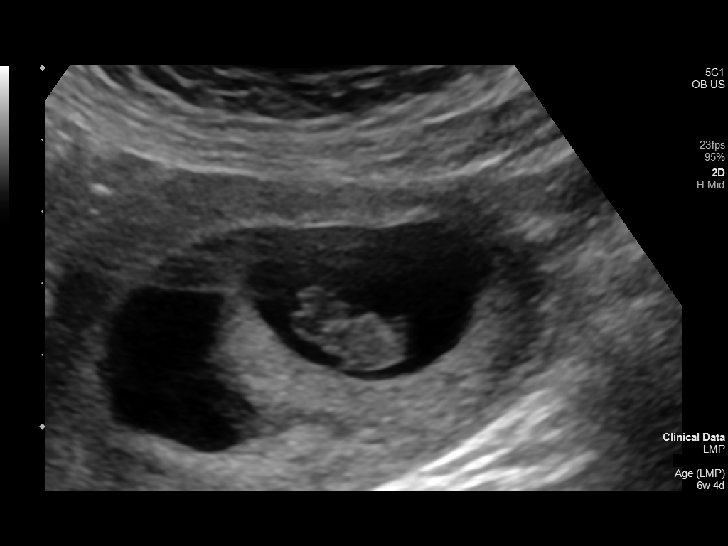
[im 20/107]
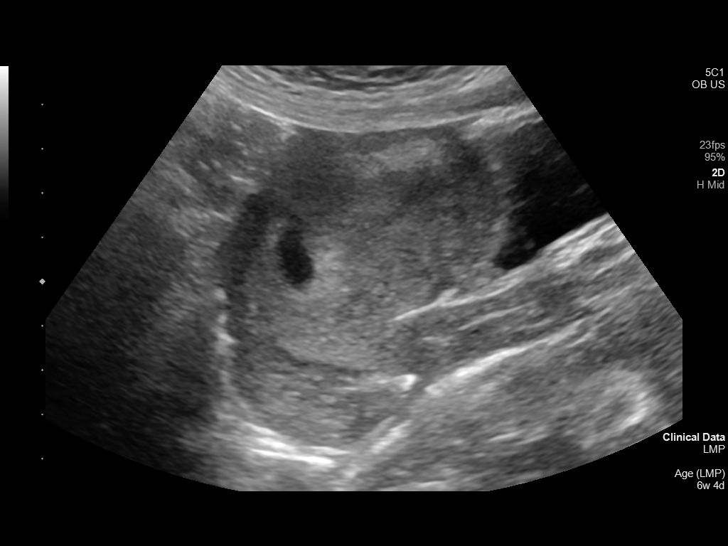
[im 28/107]
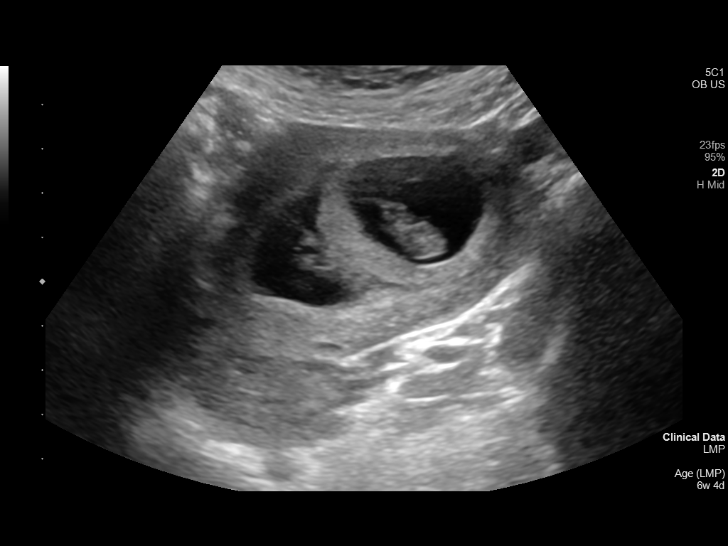
[im 36/107]
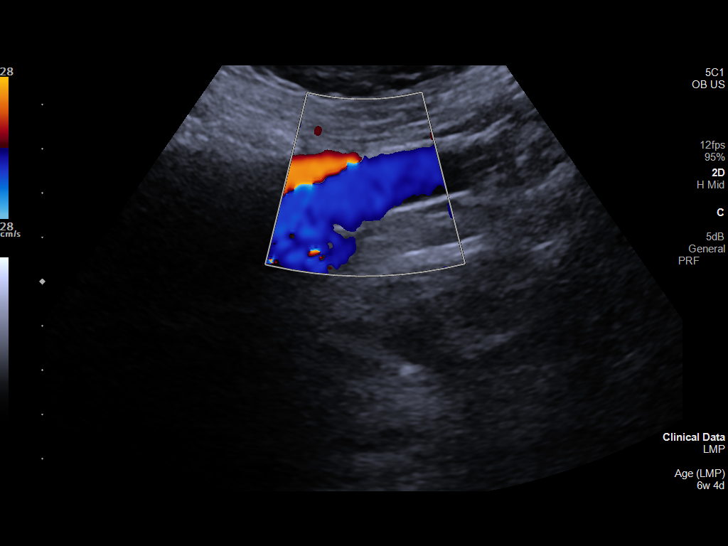
[im 44/107]
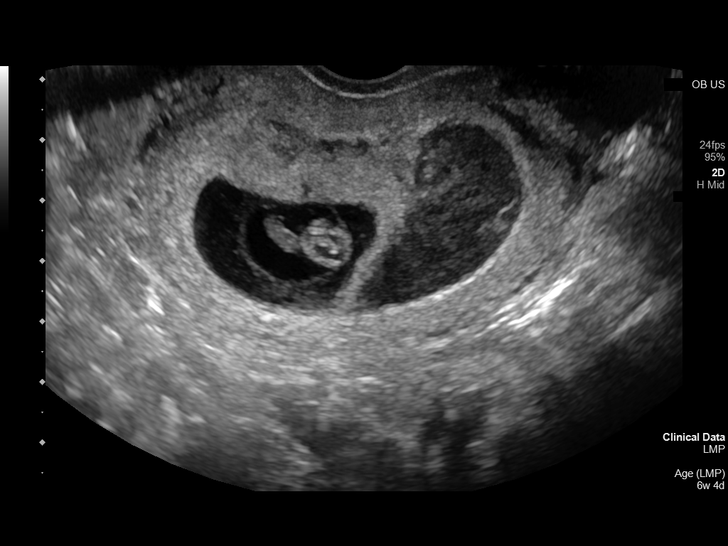
[im 55/107]
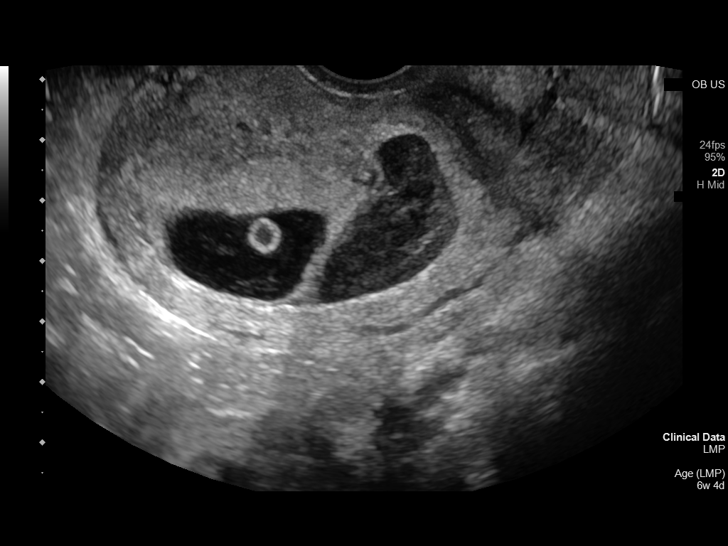
[im 63/107]
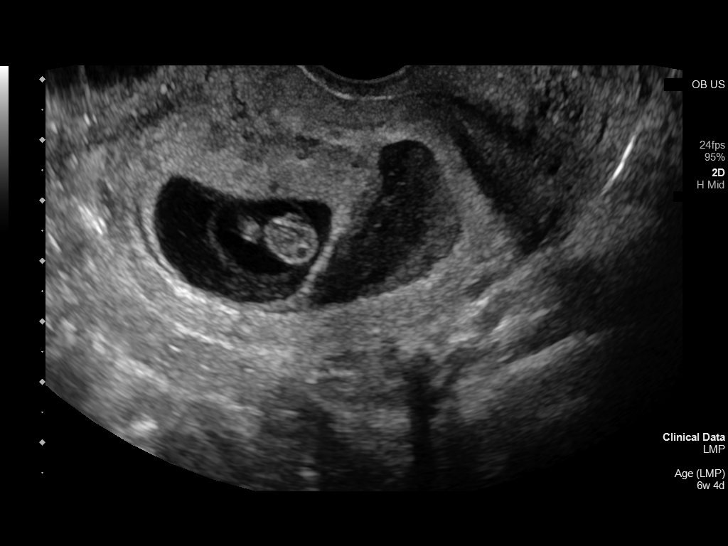
[im 71/107]
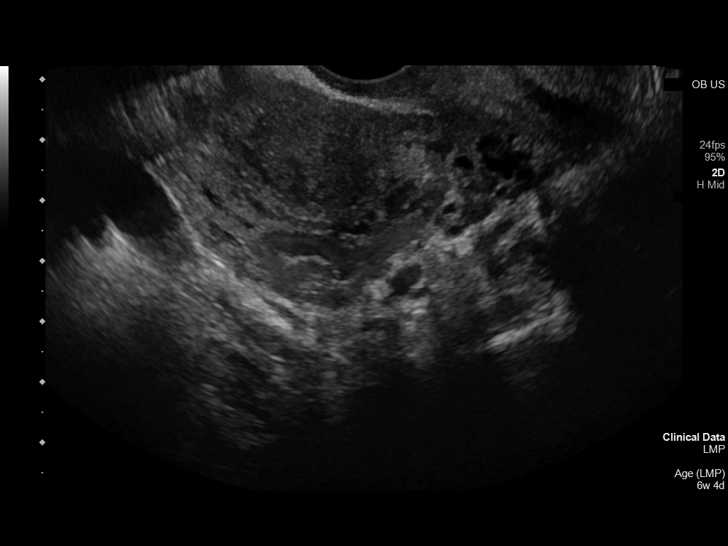
[im 79/107]
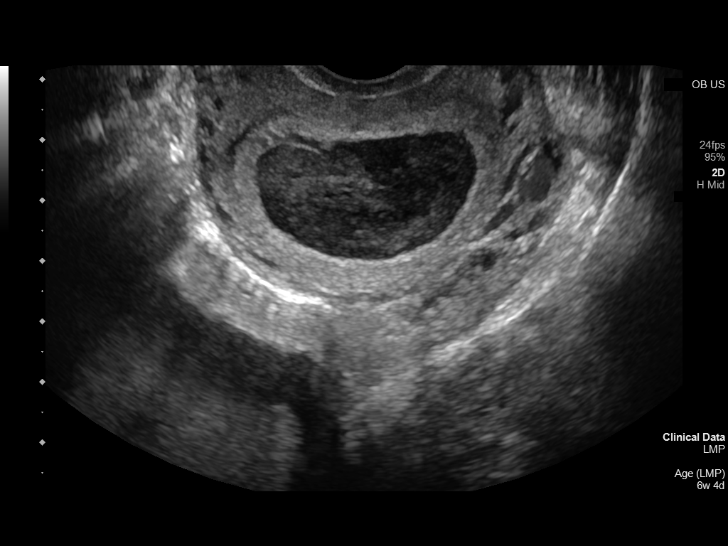
[im 87/107]
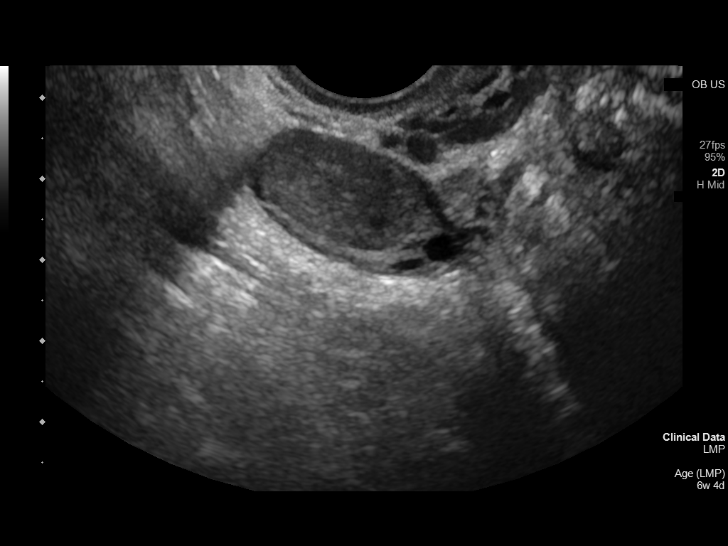
[im 95/107]
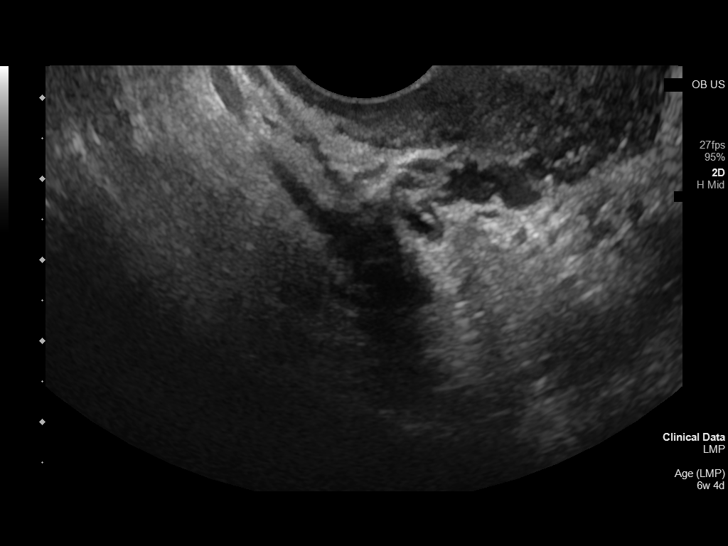
[im 103/107]
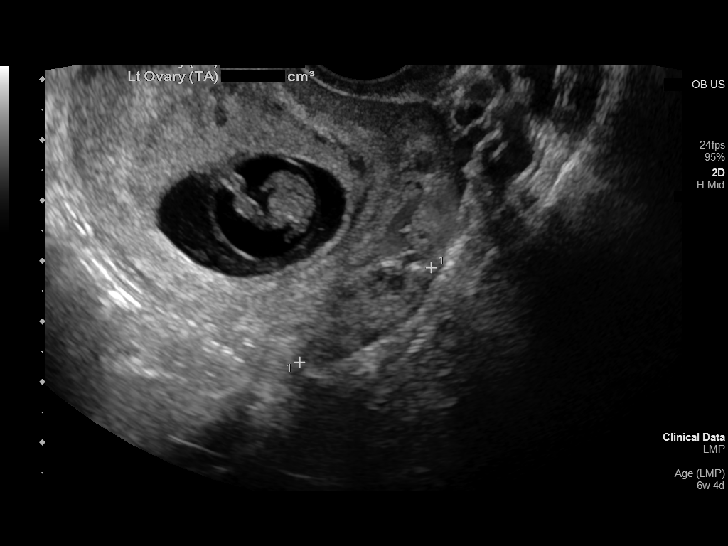

[13 of 28 positions shown; findings below may reference images not displayed]

FINDINGS: Intrauterine gestational sac: Visualized

Yolk sac:  Visualized.

Embryo:  Visualized.

Cardiac Activity: Visualized.

Heart Rate: 178 bpm

MSD:   mm    w     d

CRL:   18 mm   8 w 2 d                  US EDC: 04/27/2022

Subchorionic hemorrhage:  See below

Maternal uterus/adnexae: There is a complex fluid collection
adjacent to the gestational sac with internal echoes. Is difficult
to determine if this is a moderate to large subchorionic hemorrhage
or and empty gestational sac. No adnexal mass. No free fluid.

Pulsed Doppler evaluation of both ovaries demonstrates normal
appearing low-resistance arterial and venous waveforms.
IMPRESSION: Intrauterine gestation, 8 weeks 2 days by crown-rump length. Fetal
heart rate 178 beats per minute.

Complex cystic area adjacent to the gestational sac could reflect a
moderate to large subchorionic hemorrhage or an empty gestational
sac.

No evidence of ovarian torsion.

## 2023-08-21 ENCOUNTER — Ambulatory Visit: Payer: Medicaid Other | Admitting: Internal Medicine
